# Patient Record
Sex: Female | Born: 1990 | Race: White | Hispanic: No | Marital: Single | Smoking: Never smoker
Health system: Southern US, Community
[De-identification: ages and names within clinical notes are randomized; demographics above are authoritative.]

## PROBLEM LIST (undated history)

## (undated) DIAGNOSIS — R140 Abdominal distension (gaseous): Secondary | ICD-10-CM

## (undated) DIAGNOSIS — R142 Eructation: Secondary | ICD-10-CM

## (undated) DIAGNOSIS — R109 Unspecified abdominal pain: Secondary | ICD-10-CM

## (undated) DIAGNOSIS — R079 Chest pain, unspecified: Secondary | ICD-10-CM

## (undated) HISTORY — DX: Unspecified abdominal pain: R10.9

## (undated) HISTORY — DX: Abdominal distension (gaseous): R14.0

## (undated) HISTORY — DX: Eructation: R14.2

## (undated) HISTORY — PX: WISDOM TOOTH EXTRACTION: SHX21

## (undated) HISTORY — DX: Chest pain, unspecified: R07.9

---

## 2016-05-19 ENCOUNTER — Ambulatory Visit (INDEPENDENT_AMBULATORY_CARE_PROVIDER_SITE_OTHER): Payer: BLUE CROSS/BLUE SHIELD | Admitting: Primary Care

## 2016-05-19 ENCOUNTER — Encounter: Payer: Self-pay | Admitting: Primary Care

## 2016-05-19 ENCOUNTER — Other Ambulatory Visit: Payer: Self-pay | Admitting: Primary Care

## 2016-05-19 VITALS — BP 116/70 | HR 54 | Temp 98.1°F | Ht 66.0 in | Wt 164.0 lb

## 2016-05-19 DIAGNOSIS — R197 Diarrhea, unspecified: Secondary | ICD-10-CM | POA: Diagnosis not present

## 2016-05-19 DIAGNOSIS — R1030 Lower abdominal pain, unspecified: Secondary | ICD-10-CM

## 2016-05-19 NOTE — Progress Notes (Signed)
Subjective:    Patient ID: Bridget Mata, female    DOB: 1991/03/25, 25 y.o.   MRN: [id number]  HPI  Bridget Mata is a 25 year old female who presents today to establish care and discuss the problems mentioned below. Will obtain old records.  1) Urgent Care Follow Up: Presented to Urgent Care on 06/30 with a chief complaint of diarrhea and abdominal pain. Her discomfort began on 04/25/16. She underwent stool testing through urgent care which was negative for C-Diff, Ova, Parasites, Giardia, and other cultures. Urine pregnancy and urinalysis was negative. She had traveled recently prior to her symptoms and was picking and eating raw cherries from a tree.  Since her Urgent Care visit she's noticed improvement in diarrhea but still experiences bilateral lower quadrant pain. Overall her stools are more formed and she is not experiencing much nausea. She's been eating a bland diet and slowly introducing regular food back into her diet without difficulty. Her abdominal pain occurs with and without food. Denies bloody stools, fevers, vomiting, weakness. She's taken Imodium for several days but stopped taking as it caused increased dermal pain.   Review of Systems  Constitutional: Negative for fever, chills and fatigue.  Gastrointestinal: Positive for abdominal pain and diarrhea. Negative for nausea, vomiting, constipation and blood in stool.  Neurological: Negative for weakness.       No past medical history on file.   Social History   Social History  . Marital Status: Single    Spouse Name: N/A  . Number of Children: N/A  . Years of Education: N/A   Occupational History  . Not on file.   Social History Main Topics  . Smoking status: Never Smoker   . Smokeless tobacco: Not on file  . Alcohol Use: 0.0 oz/week    0 Standard drinks or equivalent per week     Comment: social  . Drug Use: Not on file  . Sexual Activity: Not on file   Other Topics Concern  . Not on file   Social  History Narrative   Single.   Works in Chief Financial Officer.   Works for [employer] in [place].   Enjoys volleyball, active in crossfit.     Past Surgical History  Procedure Laterality Date  . Wisdom tooth extraction      Family History  Problem Relation Age of Onset  . Hyperlipidemia Father   . Hypertension Father   . Colitis Mother     Allergies  Allergen Reactions  . Sulfa Antibiotics   . Zinc     No current outpatient prescriptions on file prior to visit.   No current facility-administered medications on file prior to visit.    BP 116/70 mmHg  Pulse 54  Temp(Src) 98.1 F (36.7 C) (Oral)  Ht 5\' 6"  (1.676 m)  Wt 164 lb (74.39 kg)  BMI 26.48 kg/m2  SpO2 98%  LMP 05/04/2016    Objective:   Physical Exam  Constitutional: She appears well-nourished. She does not appear ill.  Neck: Neck supple.  Cardiovascular: Normal rate and regular rhythm.   Pulmonary/Chest: Effort normal and breath sounds normal.  Abdominal: Bowel sounds are normal. There is no tenderness.  Skin: Skin is warm and dry.          Assessment & Plan:  Abdominal pain and diarrhea:  Initially began on June 16, evaluation urgent care on June 30. Workup including stool cultures and urinalysis negative. Exam today without tenderness to abdomen, does not appear acutely ill, vitals stable. Negative  Murphy's and McBurney's. Given negative workup, suspect viral involvement and we'll continue to treat with supportive measures especially given that her symptoms have improved. Do not suspect causes to be related to gallbladder, appendix, H. pylori, acid reflux. Will also obtain CMP and CBC to rule out any other causes. Discussed to avoid Imodium, slowly advance her diet as she has been doing, notify me if she develops fevers/chills/vomiting/increased abdominal pain. Labs pending.  Bridget Mata,Bridget Llanas Kendal, NP

## 2016-05-19 NOTE — Progress Notes (Signed)
Pre visit review using our clinic review tool, if applicable. No additional management support is needed unless otherwise documented below in the visit note. 

## 2016-05-19 NOTE — Patient Instructions (Signed)
Complete lab work prior to leaving today. I will notify you of your results once received.   Please notify me if you develop any fevers, increased pain, vomiting.  It was a pleasure to meet you today! Please don't hesitate to call me with any questions. Welcome to [facility]!

## 2016-05-20 LAB — COMPREHENSIVE METABOLIC PANEL
ALBUMIN: 4.3 g/dL (ref 3.5–5.2)
ALT: 15 U/L (ref 0–35)
AST: 16 U/L (ref 0–37)
Alkaline Phosphatase: 56 U/L (ref 39–117)
BUN: 13 mg/dL (ref 6–23)
CHLORIDE: 103 meq/L (ref 96–112)
CO2: 26 mEq/L (ref 19–32)
Calcium: 9.7 mg/dL (ref 8.4–10.5)
Creatinine, Ser: 0.73 mg/dL (ref 0.40–1.20)
GFR: 104.23 mL/min (ref >60.00)
Glucose, Bld: 86 mg/dL (ref 70–99)
POTASSIUM: 4 meq/L (ref 3.5–5.1)
SODIUM: 137 meq/L (ref 135–145)
Total Bilirubin: 0.3 mg/dL (ref 0.2–1.2)
Total Protein: 7.3 g/dL (ref 6.0–8.3)

## 2016-05-20 LAB — CBC WITH DIFFERENTIAL/PLATELET
BASOS PCT: 0.7 % (ref 0.0–3.0)
Basophils Absolute: 0.1 10*3/uL (ref 0.0–0.1)
EOS PCT: 4.2 % (ref 0.0–5.0)
Eosinophils Absolute: 0.3 10*3/uL (ref 0.0–0.7)
HCT: 38.9 % (ref 36.0–46.0)
HEMOGLOBIN: 13.2 g/dL (ref 12.0–15.0)
LYMPHS ABS: 2.4 10*3/uL (ref 0.7–4.0)
Lymphocytes Relative: 34.8 % (ref 12.0–46.0)
MCHC: 34 g/dL (ref 30.0–36.0)
MCV: 88.3 fl (ref 78.0–100.0)
MONO ABS: 0.7 10*3/uL (ref 0.1–1.0)
Monocytes Relative: 10.1 % (ref 3.0–12.0)
NEUTROS PCT: 50.2 % (ref 43.0–77.0)
Neutro Abs: 3.4 10*3/uL (ref 1.4–7.7)
Platelets: 274 10*3/uL (ref 150.0–400.0)
RBC: 4.41 Mil/uL (ref 3.87–5.11)
RDW: 12.3 % (ref 11.5–15.5)
WBC: 6.9 10*3/uL (ref 4.0–10.5)

## 2016-05-21 ENCOUNTER — Telehealth: Payer: Self-pay | Admitting: Primary Care

## 2016-05-21 NOTE — Telephone Encounter (Signed)
Patient returned Chan's call. °

## 2016-05-21 NOTE — Telephone Encounter (Signed)
Spoken and notified patient of [name]'s comments. Patient verbalized understanding. 

## 2016-08-05 ENCOUNTER — Encounter: Payer: Self-pay | Admitting: Sports Medicine

## 2016-08-05 ENCOUNTER — Other Ambulatory Visit: Payer: Self-pay

## 2016-08-05 ENCOUNTER — Ambulatory Visit
Admission: RE | Admit: 2016-08-05 | Discharge: 2016-08-05 | Disposition: A | Discharge status: Home / Self Care | Payer: BLUE CROSS/BLUE SHIELD | Source: Ambulatory Visit | Attending: Sports Medicine | Admitting: Sports Medicine

## 2016-08-05 ENCOUNTER — Ambulatory Visit (INDEPENDENT_AMBULATORY_CARE_PROVIDER_SITE_OTHER): Payer: BLUE CROSS/BLUE SHIELD | Admitting: Sports Medicine

## 2016-08-05 VITALS — BP 104/71 | Ht 67.0 in | Wt 160.0 lb

## 2016-08-05 DIAGNOSIS — M25512 Pain in left shoulder: Secondary | ICD-10-CM

## 2016-08-05 NOTE — Progress Notes (Signed)
   [facility] [address] [place], [place] [address] Phone: [phone] Fax: [phone]   Patient Name: Bridget Mata Date of Birth: 04/13/1991 Medical Record Number: [id number] Gender: female Date of Encounter: 08/05/2016  History of Present Illness:  Bridget Mata is a 25 year old woman who presents with the following:  Left shoulder pain: Her pain started a year ago. She had started Crossfit 1-2 months prior to the start of her pain. Her pain occurred with overhead exercises (pull ups, etc). She stopped doing these exercises and her pain began to improve. Her pain returned. She has been working with physical therapy for the past 7 weeks, which improved some of the pain but it has not resolved. The pain is located on her anterior shoulder in the biceps tendon region. Activities that exacerbate the pain include pulling pants on, reaching over to her backpack next to her and pulling it towards her. She has not taken any OTC pain meds for her pain. She also has right shoulder pain but this pain is milder. Denies past shoulder surgeries. Denies trauma to her shoulder.  No past medical history on file.   Past Surgical History:  Procedure Laterality Date  . WISDOM TOOTH EXTRACTION     Social History  Substance Use Topics  . Smoking status: Never Smoker  . Smokeless tobacco: Not on file  . Alcohol use 0.0 oz/week     Comment: social   Family History  Problem Relation Age of Onset  . Hyperlipidemia Father   . Hypertension Father   . Colitis Mother    Allergies  Allergen Reactions  . Sulfa Antibiotics   . Zinc     Medication list has been reviewed and updated.  Prior to Admission medications   Medication Sig Start Date End Date Taking? Authorizing Provider  drospirenone-ethinyl estradiol (LORYNA) 3-0.02 MG tablet Take 1 tablet by mouth daily.    Historical Provider, MD    Review of Systems: No fevers, chills, night sweats, weight  loss, chest pain, or shortness of breath.  Physical Examination: Blood pressure 104/71, height 5\' 7"  (1.702 m), weight 72.6 kg (160 lb).  Body mass index is 25.06 kg/m.  Gen: Well NAD  Shoulder: Inspection reveals no abnormalities, atrophy or asymmetry bilaterally. Palpation with no tenderness over AC joint, mild tenderness in L bicipital groove. ROM is full in all planes. Rotator cuff strength normal throughout. Mild pain with empty can on L. Mild pain with Speeds on L. Mild pain with Obrien's on L. Normal scapular function observed. No painful arc and no drop arm sign. No apprehension sign  Assessment and Plan: Left shoulder pain: Mild effusion around biceps tendon and some calcific deposits in glenohumeral joint seen on US. Concern for labral pathology.  -XR of L shoulder -MR arthrogram of L shoulder -Continue PT for now  Griffin BasilJennifer Krall, MD  Internal Medicine PGY-3 08/05/16 11:07 AM   Patient seen and evaluated with the resident. I agree with the above plan of care. X-rays of her left shoulder are unremarkable. Ultrasound shows a relatively normal rotator cuff with some calcification of the posterior labrum. I'm concerned that the patient may have a labral tear which may need arthroscopic debridement or repair. Therefore, we will proceed with an MRI arthrogram to evaluate further. Phone follow-up after that study to discuss the results and delineate further treatment.

## 2016-08-06 ENCOUNTER — Other Ambulatory Visit: Payer: Self-pay | Admitting: Sports Medicine

## 2016-08-13 ENCOUNTER — Other Ambulatory Visit (HOSPITAL_COMMUNITY): Payer: BLUE CROSS/BLUE SHIELD

## 2016-08-13 ENCOUNTER — Ambulatory Visit (HOSPITAL_COMMUNITY): Payer: BLUE CROSS/BLUE SHIELD

## 2016-08-15 ENCOUNTER — Ambulatory Visit (HOSPITAL_COMMUNITY)
Admission: RE | Admit: 2016-08-15 | Discharge: 2016-08-15 | Disposition: A | Discharge status: Home / Self Care | Payer: BLUE CROSS/BLUE SHIELD | Source: Ambulatory Visit | Attending: Sports Medicine | Admitting: Sports Medicine

## 2016-08-15 DIAGNOSIS — M7580 Other shoulder lesions, unspecified shoulder: Secondary | ICD-10-CM | POA: Insufficient documentation

## 2016-08-15 DIAGNOSIS — M25512 Pain in left shoulder: Secondary | ICD-10-CM

## 2016-08-15 MED ORDER — LIDOCAINE HCL 1 % IJ SOLN
INTRAMUSCULAR | Status: AC
  Filled 2016-08-15: qty 10

## 2016-08-15 MED ORDER — GADOBENATE DIMEGLUMINE 529 MG/ML IV SOLN
5.0000 mL | Freq: Once | INTRAVENOUS | Status: AC | PRN
  Administered 2016-08-15: 0.05 mL via INTRA_ARTICULAR

## 2016-08-15 MED ORDER — IOPAMIDOL (ISOVUE-M 200) INJECTION 41%
20.0000 mL | Freq: Once | INTRAMUSCULAR | Status: AC
  Administered 2016-08-15: 10 mL via INTRA_ARTICULAR

## 2016-08-15 MED ORDER — IOPAMIDOL (ISOVUE-M 200) INJECTION 41%
INTRAMUSCULAR | Status: AC
  Administered 2016-08-15: 10 mL via INTRA_ARTICULAR
  Filled 2016-08-15: qty 10

## 2016-08-15 MED ORDER — LIDOCAINE HCL (PF) 1 % IJ SOLN
10.0000 mL | Freq: Once | INTRAMUSCULAR | Status: AC
Start: 2016-08-15 — End: 2016-08-15
  Administered 2016-08-15: 10 mL via INTRADERMAL

## 2016-08-18 ENCOUNTER — Other Ambulatory Visit: Payer: BLUE CROSS/BLUE SHIELD

## 2016-08-21 ENCOUNTER — Encounter: Payer: Self-pay | Admitting: Sports Medicine

## 2016-08-21 ENCOUNTER — Ambulatory Visit (INDEPENDENT_AMBULATORY_CARE_PROVIDER_SITE_OTHER): Payer: BLUE CROSS/BLUE SHIELD | Admitting: Sports Medicine

## 2016-08-21 VITALS — BP 122/74 | HR 54 | Ht 67.0 in | Wt 160.0 lb

## 2016-08-21 DIAGNOSIS — M25512 Pain in left shoulder: Secondary | ICD-10-CM

## 2016-08-21 MED ORDER — DICLOFENAC SODIUM 75 MG PO TBEC: DELAYED_RELEASE_TABLET | ORAL | 0 refills | Status: DC

## 2016-08-22 NOTE — Progress Notes (Signed)
  Patient comes in today to discuss MRI findings of her left shoulder. She has some mild tendinopathy of the infraspinatus tendon. Otherwise, the MRI is unremarkable. No labral tear. No full-thickness rotator cuff tear. I offered her a subacromial cortisone injection but she would rather try oral anti-inflammatories. Therefore, we will try her on some diclofenac and I will have her return to physical therapy. 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 He may need to change up some of his treatments. She will follow-up with me in 4 weeks for reevaluation and if symptoms persist we will reconsider merits of a subacromial cortisone injection.  Total time spent with the patient was 10 minutes with greater than 50% of the time spent in face-to-face consultation discussing MRI findings and treatment options.

## 2016-09-22 ENCOUNTER — Encounter: Payer: Self-pay | Admitting: Sports Medicine

## 2016-09-22 ENCOUNTER — Ambulatory Visit (INDEPENDENT_AMBULATORY_CARE_PROVIDER_SITE_OTHER): Payer: BLUE CROSS/BLUE SHIELD | Admitting: Sports Medicine

## 2016-09-22 VITALS — BP 118/76 | Ht 67.0 in | Wt 160.0 lb

## 2016-09-22 DIAGNOSIS — G8929 Other chronic pain: Secondary | ICD-10-CM | POA: Diagnosis not present

## 2016-09-22 DIAGNOSIS — M25512 Pain in left shoulder: Secondary | ICD-10-CM

## 2016-09-22 MED ORDER — METHYLPREDNISOLONE ACETATE 40 MG/ML IJ SUSP
40.0000 mg | Freq: Once | INTRAMUSCULAR | Status: AC
  Administered 2016-09-22: 40 mg via INTRA_ARTICULAR

## 2016-09-22 NOTE — Progress Notes (Signed)
   Subjective:    Patient ID: Bridget Mata, female    DOB: 01/09/1991, 25 y.o.   MRN: [id number]  HPI    Review of Systems     Objective:   Physical Exam        Assessment & Plan:   

## 2016-09-22 NOTE — Progress Notes (Signed)
Patient ID: Bridget Mata, female   DOB: 05/03/1991, 25 y.o.   MRN: [id number]  Patient comes in today for follow-up on left shoulder pain. Pain is still present despite continuing with physical therapy. We have discussed a subacromial cortisone injection if symptoms persisted. She would like to go ahead with that. Subacromial injection was performed today after risks and benefits were explained. Patient tolerated this without difficulty. If symptoms persist for another 4-6 weeks despite today's injection, she is instructed to call me and I would consider referral to Dr. Teryl LucyJoshua Landau. Although her previous MRI arthrogram showed only some mild infraspinatus tendinopathy, there is a chance that there could be an occult labral tear that was not picked up on that study.  Consent obtained and verified. Time-out conducted. Noted no overlying erythema, induration, or other signs of local infection. Skin prepped in a sterile fashion. Topical analgesic spray: Ethyl chloride. Joint: left shoulder (subacromial) Needle: 25g 1.5 inch Completed without difficulty. Meds: 3cc 1% xylocaine, 1cc (40mg ) depomedrol  Advised to call if fevers/chills, erythema, induration, drainage, or persistent bleeding.

## 2016-09-22 NOTE — Progress Notes (Signed)
   Subjective:    Patient ID: Bridget Mata, female    DOB: 01/22/1991, 25 y.o.   MRN: [id number]  HPI    Review of Systems     Objective:   Physical Exam        Assessment & Plan:

## 2016-09-26 DIAGNOSIS — Z6825 Body mass index (BMI) 25.0-25.9, adult: Secondary | ICD-10-CM | POA: Diagnosis not present

## 2016-09-26 DIAGNOSIS — Z23 Encounter for immunization: Secondary | ICD-10-CM | POA: Diagnosis not present

## 2016-12-10 DIAGNOSIS — G8929 Other chronic pain: Secondary | ICD-10-CM | POA: Diagnosis not present

## 2016-12-10 DIAGNOSIS — M25512 Pain in left shoulder: Secondary | ICD-10-CM | POA: Diagnosis not present

## 2016-12-10 DIAGNOSIS — M25511 Pain in right shoulder: Secondary | ICD-10-CM | POA: Diagnosis not present

## 2017-01-21 DIAGNOSIS — H6692 Otitis media, unspecified, left ear: Secondary | ICD-10-CM | POA: Diagnosis not present

## 2017-01-21 DIAGNOSIS — J069 Acute upper respiratory infection, unspecified: Secondary | ICD-10-CM | POA: Diagnosis not present

## 2017-01-23 DIAGNOSIS — G8918 Other acute postprocedural pain: Secondary | ICD-10-CM | POA: Diagnosis not present

## 2017-01-23 DIAGNOSIS — M25312 Other instability, left shoulder: Secondary | ICD-10-CM | POA: Diagnosis not present

## 2017-01-23 DIAGNOSIS — M24812 Other specific joint derangements of left shoulder, not elsewhere classified: Secondary | ICD-10-CM | POA: Diagnosis not present

## 2017-01-23 DIAGNOSIS — M25512 Pain in left shoulder: Secondary | ICD-10-CM | POA: Diagnosis not present

## 2017-01-23 DIAGNOSIS — S43014A Anterior dislocation of right humerus, initial encounter: Secondary | ICD-10-CM | POA: Diagnosis not present

## 2017-01-23 DIAGNOSIS — M24412 Recurrent dislocation, left shoulder: Secondary | ICD-10-CM | POA: Diagnosis not present

## 2017-01-23 DIAGNOSIS — Z4789 Encounter for other orthopedic aftercare: Secondary | ICD-10-CM | POA: Diagnosis not present

## 2017-01-23 DIAGNOSIS — R600 Localized edema: Secondary | ICD-10-CM | POA: Diagnosis not present

## 2017-02-20 DIAGNOSIS — M25312 Other instability, left shoulder: Secondary | ICD-10-CM | POA: Diagnosis not present

## 2017-02-20 DIAGNOSIS — R600 Localized edema: Secondary | ICD-10-CM | POA: Diagnosis not present

## 2017-02-20 DIAGNOSIS — M25512 Pain in left shoulder: Secondary | ICD-10-CM | POA: Diagnosis not present

## 2017-02-20 DIAGNOSIS — Z4789 Encounter for other orthopedic aftercare: Secondary | ICD-10-CM | POA: Diagnosis not present

## 2017-03-04 DIAGNOSIS — M25512 Pain in left shoulder: Secondary | ICD-10-CM | POA: Diagnosis not present

## 2017-03-09 DIAGNOSIS — M25512 Pain in left shoulder: Secondary | ICD-10-CM | POA: Diagnosis not present

## 2017-03-11 DIAGNOSIS — Z6826 Body mass index (BMI) 26.0-26.9, adult: Secondary | ICD-10-CM | POA: Diagnosis not present

## 2017-03-11 DIAGNOSIS — Z01419 Encounter for gynecological examination (general) (routine) without abnormal findings: Secondary | ICD-10-CM | POA: Diagnosis not present

## 2017-03-12 DIAGNOSIS — M25512 Pain in left shoulder: Secondary | ICD-10-CM | POA: Diagnosis not present

## 2017-03-16 DIAGNOSIS — M25512 Pain in left shoulder: Secondary | ICD-10-CM | POA: Diagnosis not present

## 2017-03-16 IMAGING — CR DG SHOULDER 2+V*L*
3 series · 3 of 3 positions shown · non-contrast
Comparison: None.

CLINICAL DATA: Pain for 4 months

EXAM:
LEFT SHOULDER - 2+ VIEW

[w shoulder ap internal left]
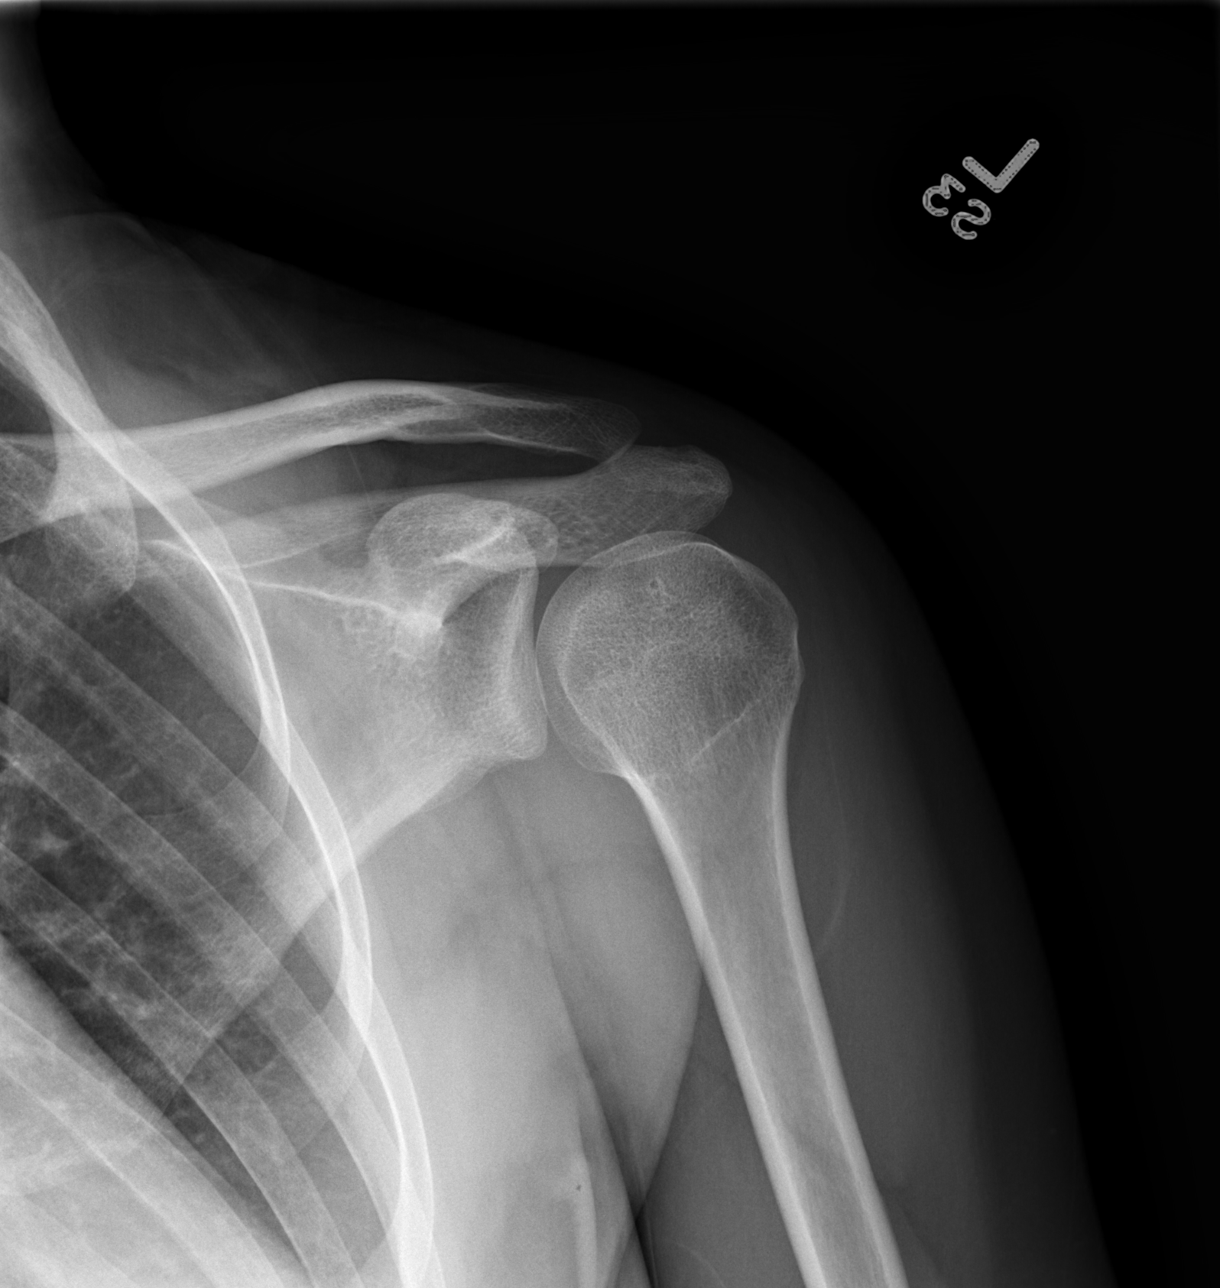

[w shoulder ap external left *]
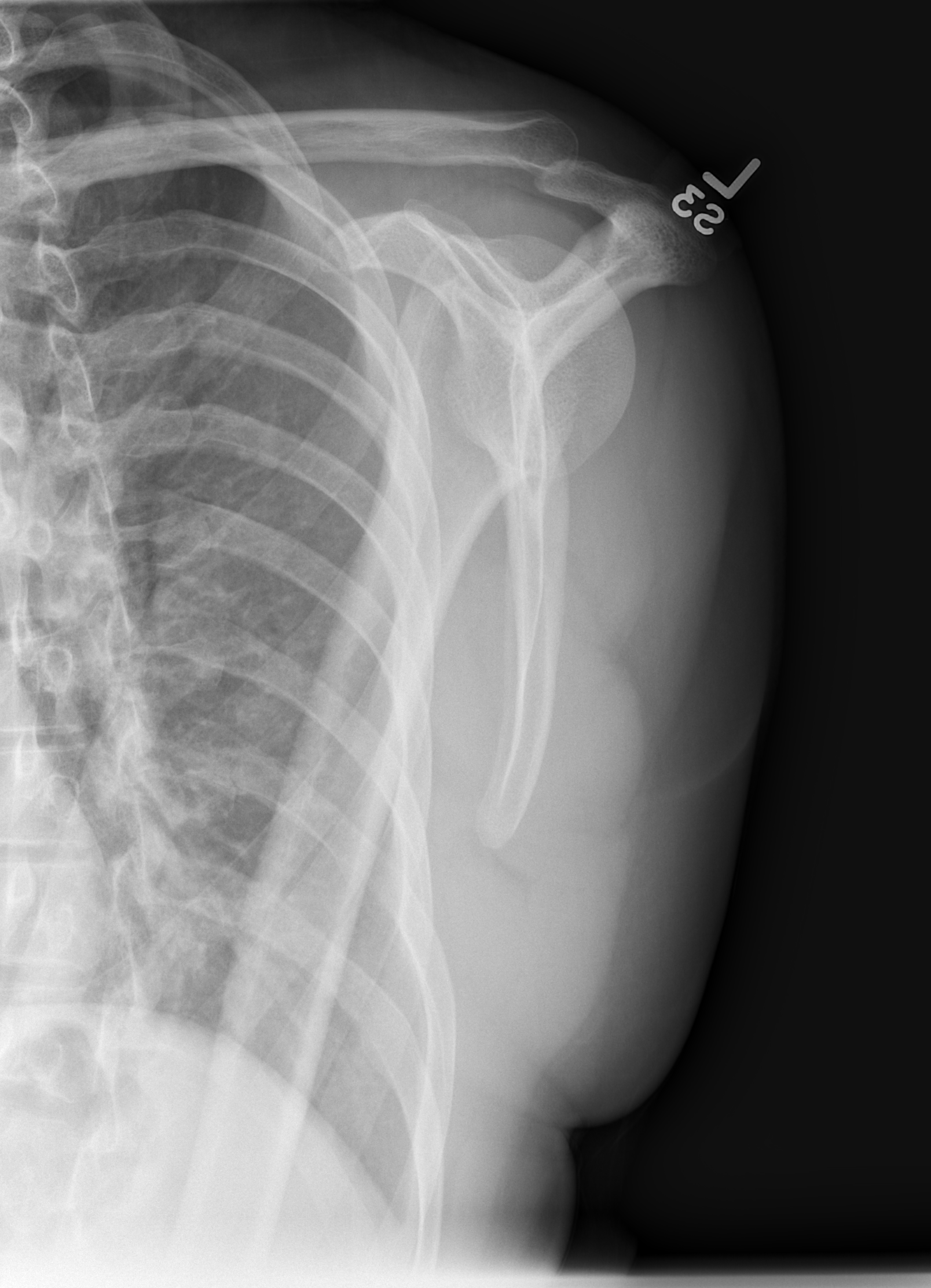

[w shoulder y view left *]
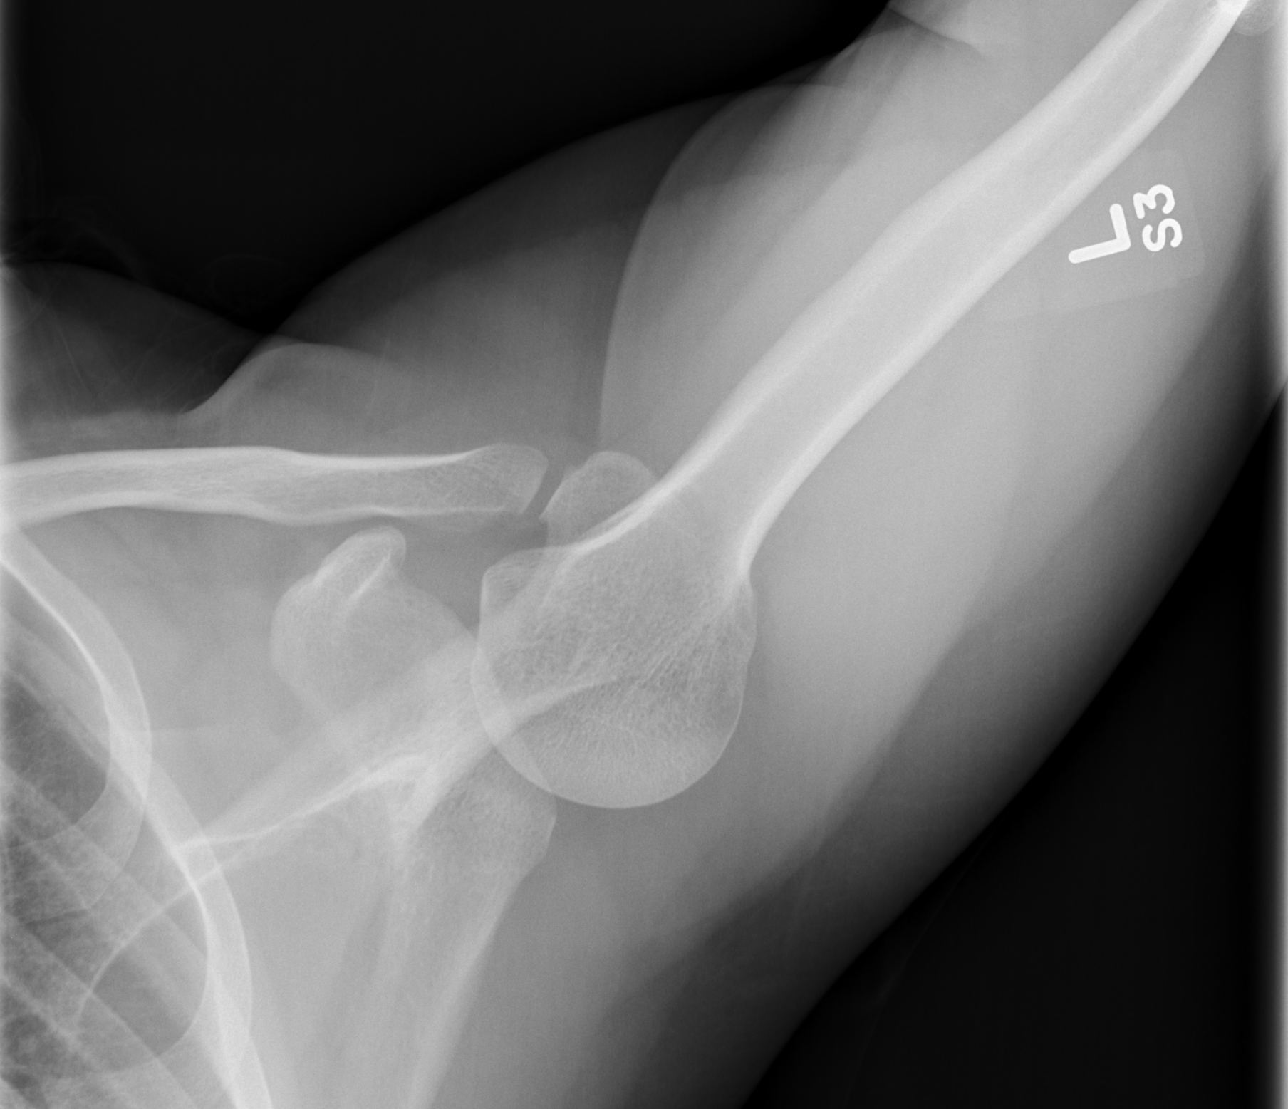

[3 of 3 positions shown; findings below may reference images not displayed]

FINDINGS: Frontal, Y scapular, and axillary images were obtained. There is no
fracture or dislocation. Joint spaces appear normal. No erosive
change. Visualized left lung is clear.
IMPRESSION: No fracture or dislocation.  No evident arthropathy.

## 2017-03-24 DIAGNOSIS — M25512 Pain in left shoulder: Secondary | ICD-10-CM | POA: Diagnosis not present

## 2017-03-26 DIAGNOSIS — M25512 Pain in left shoulder: Secondary | ICD-10-CM | POA: Diagnosis not present

## 2017-03-26 IMAGING — MR MR SHOULDER*L* W/ CM
4 of 6 series · 19 of 40 positions shown · IV contrast (YES YES)
Comparison: None.

CLINICAL DATA: Left shoulder pain.  Pain with overhead exercises.

EXAM:
MR ARTHROGRAM OF THE LEFT SHOULDER
TECHNIQUE: Multiplanar, multisequence MR imaging of the left shoulder was
performed following the administration of intra-articular contrast.
CONTRAST:  See Injection Documentation.

[Series 3: T1 fat-sat · axial · 3.0mm · 0.23mm/px · z∈[-79,-20]mm · 3 of 27 slices shown (1 of 2)]
[im 4/27]
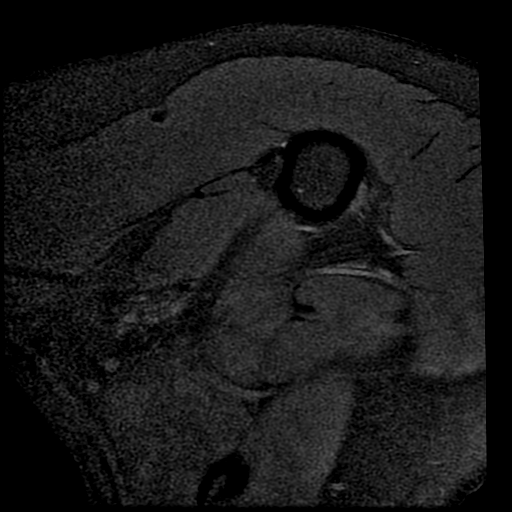
[im 15/27]
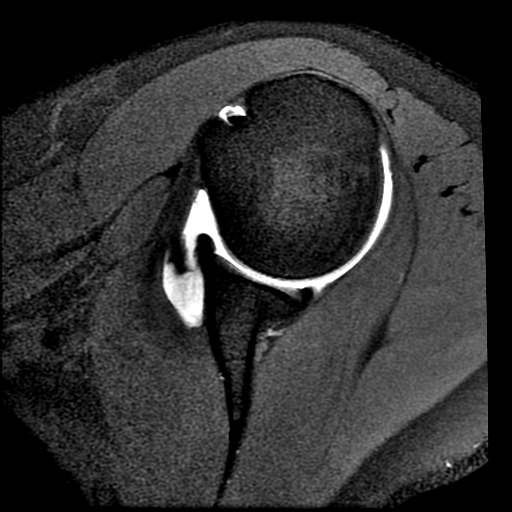
[im 23/27]
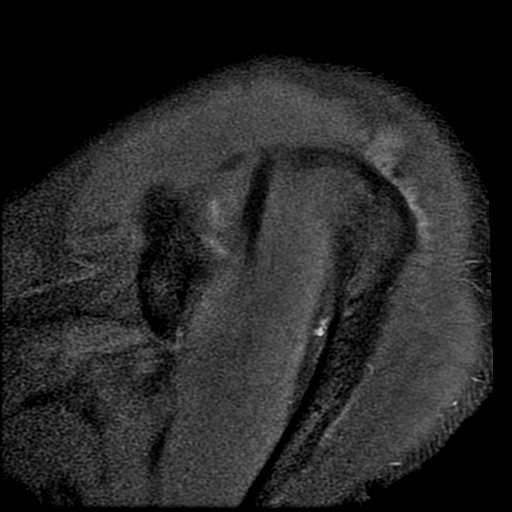

[Series 4: T1 fat-sat · oblique · 3.0mm · 0.27mm/px · 3 of 25 slices shown (2 of 2)]
[im 5/25]
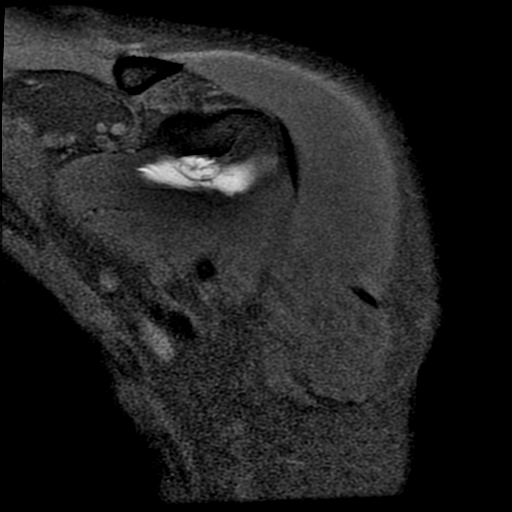
[im 15/25]
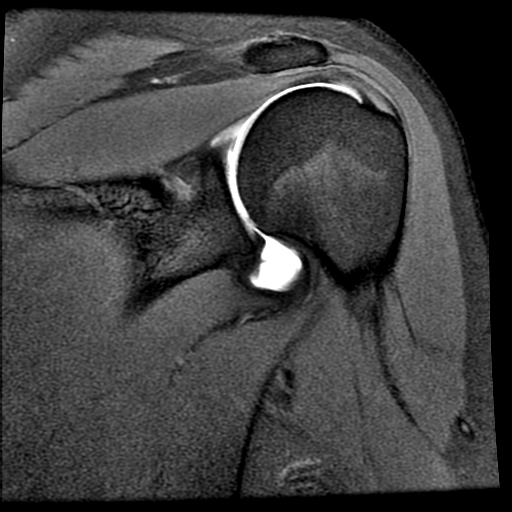
[im 25/25]
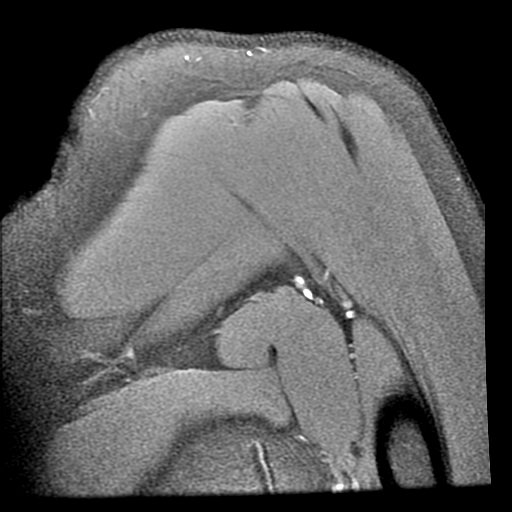

[Series 6: T2 fat-sat · oblique · 3.0mm · 0.27mm/px · 6 of 25 slices shown (1 of 2)]
[im 1/25]
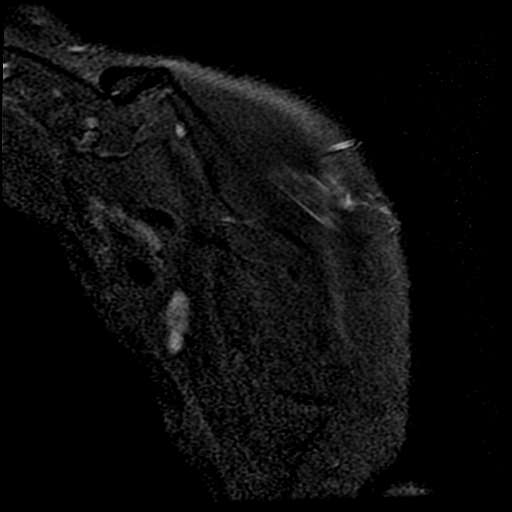
[im 5/25]
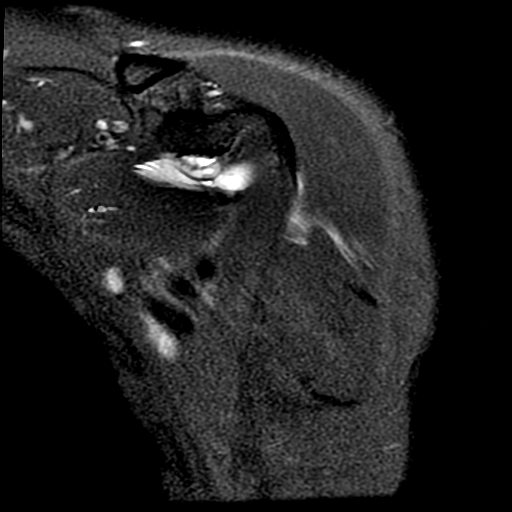
[im 10/25]
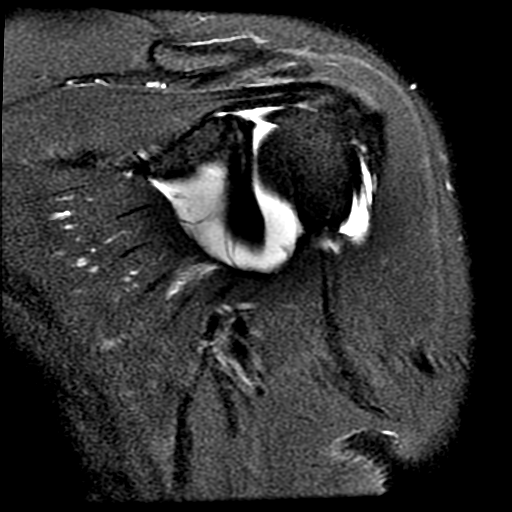
[im 15/25]
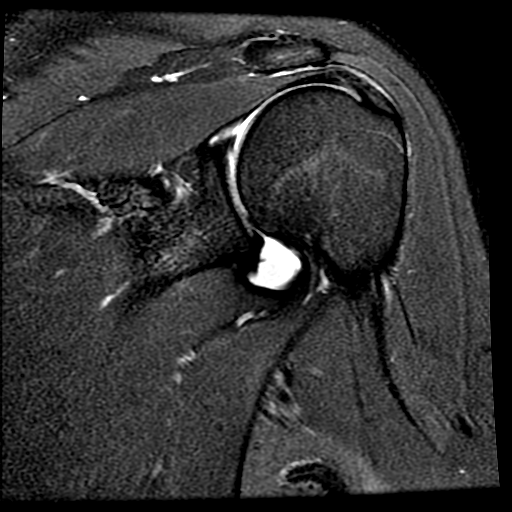
[im 20/25]
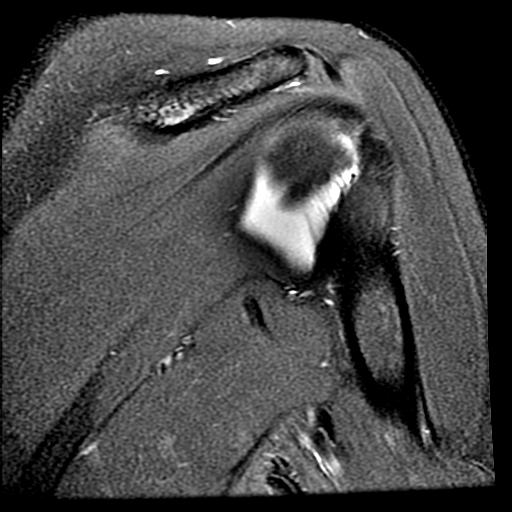
[im 25/25]
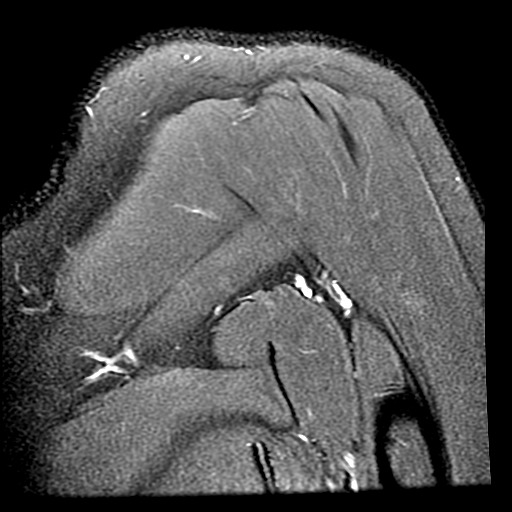

[Series 7: T2 fat-sat · sagittal · 3.0mm · 0.27mm/px · 7 of 31 slices shown (2 of 2)]
[im 1/31]
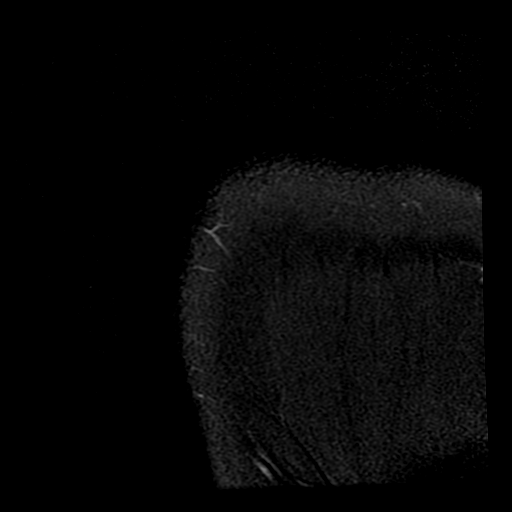
[im 5/31]
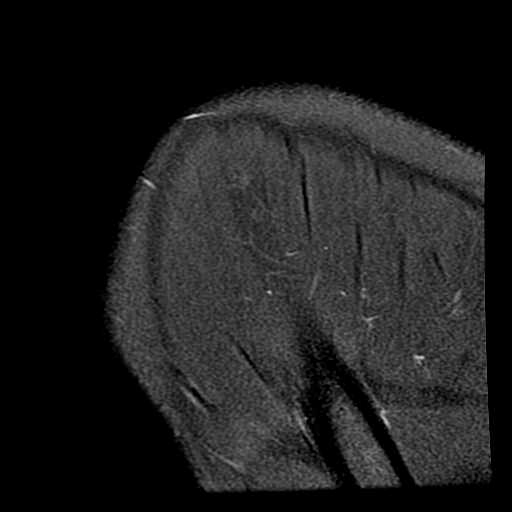
[im 9/31]
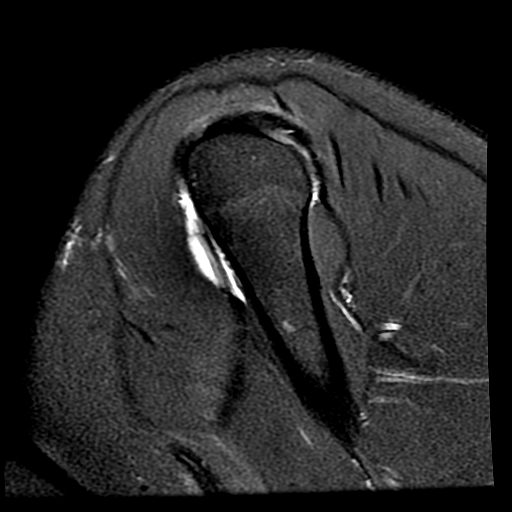
[im 13/31]
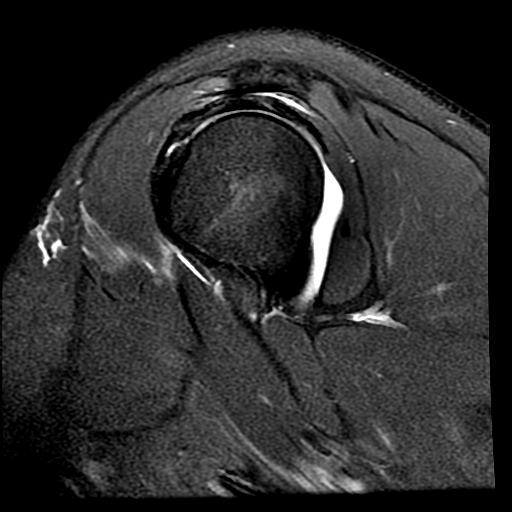
[im 18/31]
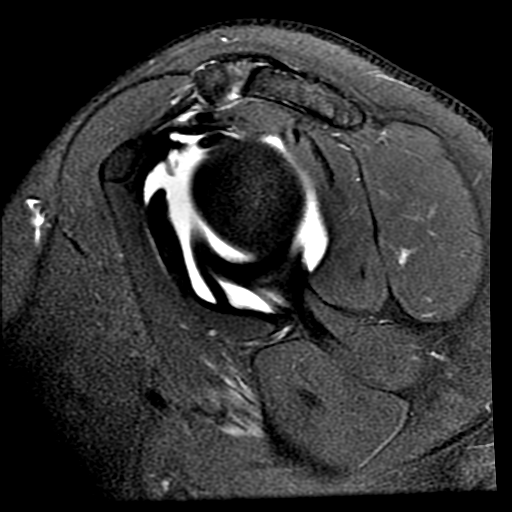
[im 22/31]
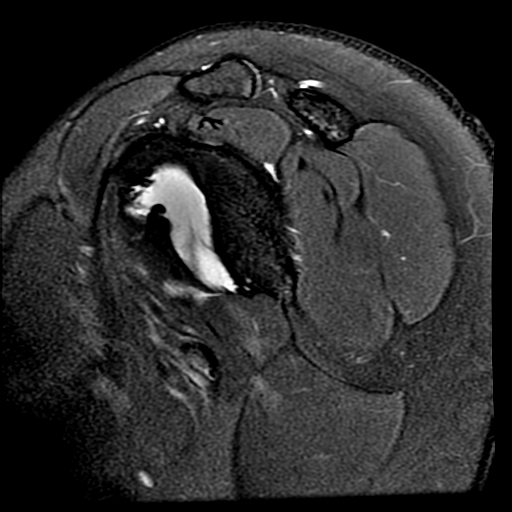
[im 26/31]
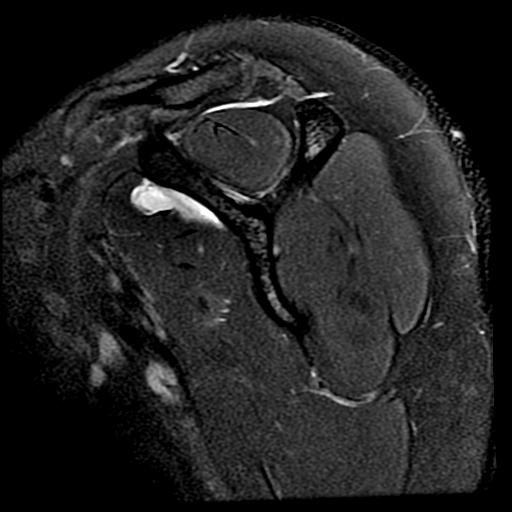

[19 of 40 positions shown; findings below may reference images not displayed]

FINDINGS: Rotator cuff: Supraspinatus tendon is intact. Mild tendinosis of the
infraspinatus tendon. Teres minor tendon is intact. Subscapularis
tendon is intact.

Muscles: No atrophy or fatty replacement of nor abnormal signal
within, the muscles of the rotator cuff.

Biceps long head: Intact.

Acromioclavicular Joint: Normal acromioclavicular joint. Trace
subacromial/ subdeltoid bursal fluid. Type II acromion.

Glenohumeral Joint: Intraarticular contrast distending the joint
capsule. No chondral defect. Normal glenohumeral ligaments.

Labrum: Intact.

Bones: No marrow signal abnormality.  No fracture or dislocation.
IMPRESSION: 1. Mild tendinosis of the infraspinatus tendon.

## 2017-03-30 DIAGNOSIS — M25512 Pain in left shoulder: Secondary | ICD-10-CM | POA: Diagnosis not present

## 2017-04-07 DIAGNOSIS — M25512 Pain in left shoulder: Secondary | ICD-10-CM | POA: Diagnosis not present

## 2017-04-09 DIAGNOSIS — M25512 Pain in left shoulder: Secondary | ICD-10-CM | POA: Diagnosis not present

## 2017-04-13 DIAGNOSIS — M25512 Pain in left shoulder: Secondary | ICD-10-CM | POA: Diagnosis not present

## 2017-04-13 DIAGNOSIS — M25511 Pain in right shoulder: Secondary | ICD-10-CM | POA: Diagnosis not present

## 2017-04-13 DIAGNOSIS — G8929 Other chronic pain: Secondary | ICD-10-CM | POA: Diagnosis not present

## 2017-04-14 DIAGNOSIS — M25512 Pain in left shoulder: Secondary | ICD-10-CM | POA: Diagnosis not present

## 2017-04-16 DIAGNOSIS — M25512 Pain in left shoulder: Secondary | ICD-10-CM | POA: Diagnosis not present

## 2017-04-23 DIAGNOSIS — M25512 Pain in left shoulder: Secondary | ICD-10-CM | POA: Diagnosis not present

## 2017-04-29 DIAGNOSIS — M25311 Other instability, right shoulder: Secondary | ICD-10-CM | POA: Diagnosis not present

## 2017-04-29 DIAGNOSIS — M25312 Other instability, left shoulder: Secondary | ICD-10-CM | POA: Diagnosis not present

## 2017-04-29 DIAGNOSIS — Z4789 Encounter for other orthopedic aftercare: Secondary | ICD-10-CM | POA: Diagnosis not present

## 2017-06-16 DIAGNOSIS — G8918 Other acute postprocedural pain: Secondary | ICD-10-CM | POA: Diagnosis not present

## 2017-06-16 DIAGNOSIS — M24411 Recurrent dislocation, right shoulder: Secondary | ICD-10-CM | POA: Diagnosis not present

## 2017-06-16 DIAGNOSIS — I890 Lymphedema, not elsewhere classified: Secondary | ICD-10-CM | POA: Diagnosis not present

## 2017-06-16 DIAGNOSIS — M19011 Primary osteoarthritis, right shoulder: Secondary | ICD-10-CM | POA: Diagnosis not present

## 2017-06-16 DIAGNOSIS — M25511 Pain in right shoulder: Secondary | ICD-10-CM | POA: Diagnosis not present

## 2017-06-16 DIAGNOSIS — M25311 Other instability, right shoulder: Secondary | ICD-10-CM | POA: Diagnosis not present

## 2017-07-29 DIAGNOSIS — M25511 Pain in right shoulder: Secondary | ICD-10-CM | POA: Diagnosis not present

## 2017-08-04 DIAGNOSIS — M25512 Pain in left shoulder: Secondary | ICD-10-CM | POA: Diagnosis not present

## 2017-08-07 DIAGNOSIS — M25512 Pain in left shoulder: Secondary | ICD-10-CM | POA: Diagnosis not present

## 2017-08-12 DIAGNOSIS — M25512 Pain in left shoulder: Secondary | ICD-10-CM | POA: Diagnosis not present

## 2017-08-14 DIAGNOSIS — M25511 Pain in right shoulder: Secondary | ICD-10-CM | POA: Diagnosis not present

## 2017-08-19 DIAGNOSIS — M25511 Pain in right shoulder: Secondary | ICD-10-CM | POA: Diagnosis not present

## 2017-08-21 DIAGNOSIS — M25511 Pain in right shoulder: Secondary | ICD-10-CM | POA: Diagnosis not present

## 2017-08-25 DIAGNOSIS — M25511 Pain in right shoulder: Secondary | ICD-10-CM | POA: Diagnosis not present

## 2017-08-27 DIAGNOSIS — M25511 Pain in right shoulder: Secondary | ICD-10-CM | POA: Diagnosis not present

## 2017-09-03 DIAGNOSIS — M25511 Pain in right shoulder: Secondary | ICD-10-CM | POA: Diagnosis not present

## 2017-09-08 DIAGNOSIS — M25511 Pain in right shoulder: Secondary | ICD-10-CM | POA: Diagnosis not present

## 2017-09-14 DIAGNOSIS — M25511 Pain in right shoulder: Secondary | ICD-10-CM | POA: Diagnosis not present

## 2017-09-16 DIAGNOSIS — Z4789 Encounter for other orthopedic aftercare: Secondary | ICD-10-CM | POA: Diagnosis not present

## 2017-09-18 DIAGNOSIS — M25511 Pain in right shoulder: Secondary | ICD-10-CM | POA: Diagnosis not present

## 2017-09-22 DIAGNOSIS — M25511 Pain in right shoulder: Secondary | ICD-10-CM | POA: Diagnosis not present

## 2017-09-29 DIAGNOSIS — M25511 Pain in right shoulder: Secondary | ICD-10-CM | POA: Diagnosis not present

## 2017-10-13 DIAGNOSIS — M25511 Pain in right shoulder: Secondary | ICD-10-CM | POA: Diagnosis not present

## 2017-10-15 DIAGNOSIS — M25511 Pain in right shoulder: Secondary | ICD-10-CM | POA: Diagnosis not present

## 2017-10-23 DIAGNOSIS — Z4789 Encounter for other orthopedic aftercare: Secondary | ICD-10-CM | POA: Diagnosis not present

## 2017-11-17 DIAGNOSIS — J019 Acute sinusitis, unspecified: Secondary | ICD-10-CM | POA: Diagnosis not present

## 2018-05-06 DIAGNOSIS — N898 Other specified noninflammatory disorders of vagina: Secondary | ICD-10-CM | POA: Diagnosis not present

## 2018-05-06 DIAGNOSIS — Z6826 Body mass index (BMI) 26.0-26.9, adult: Secondary | ICD-10-CM | POA: Diagnosis not present

## 2018-05-06 DIAGNOSIS — Z124 Encounter for screening for malignant neoplasm of cervix: Secondary | ICD-10-CM | POA: Diagnosis not present

## 2018-05-06 DIAGNOSIS — Z01419 Encounter for gynecological examination (general) (routine) without abnormal findings: Secondary | ICD-10-CM | POA: Diagnosis not present

## 2018-05-06 LAB — HM PAP SMEAR

## 2018-07-27 DIAGNOSIS — L578 Other skin changes due to chronic exposure to nonionizing radiation: Secondary | ICD-10-CM | POA: Diagnosis not present

## 2018-07-27 DIAGNOSIS — L814 Other melanin hyperpigmentation: Secondary | ICD-10-CM | POA: Diagnosis not present

## 2018-07-27 DIAGNOSIS — D225 Melanocytic nevi of trunk: Secondary | ICD-10-CM | POA: Diagnosis not present

## 2018-08-30 ENCOUNTER — Ambulatory Visit: Payer: Self-pay | Admitting: *Deleted

## 2018-08-30 ENCOUNTER — Telehealth: Payer: Self-pay

## 2018-08-30 ENCOUNTER — Ambulatory Visit: Payer: BLUE CROSS/BLUE SHIELD | Admitting: Physician Assistant

## 2018-08-30 ENCOUNTER — Encounter: Payer: Self-pay | Admitting: Physician Assistant

## 2018-08-30 VITALS — BP 120/72 | HR 62 | Temp 98.6°F | Ht 67.0 in | Wt 160.0 lb

## 2018-08-30 DIAGNOSIS — M545 Low back pain, unspecified: Secondary | ICD-10-CM

## 2018-08-30 MED ORDER — MELOXICAM 15 MG PO TABS: 15.0000 mg | ORAL_TABLET | Freq: Every day | ORAL | 0 refills | Status: DC

## 2018-08-30 MED ORDER — CYCLOBENZAPRINE HCL 5 MG PO TABS: 5.0000 mg | ORAL_TABLET | Freq: Every day | ORAL | 0 refills | Status: DC | PRN

## 2018-08-30 NOTE — Telephone Encounter (Signed)
Please advise on whom would be willing to accept patient as TOC.

## 2018-08-30 NOTE — Telephone Encounter (Signed)
Sam saw her today. Could go to her.

## 2018-08-30 NOTE — Telephone Encounter (Signed)
Absolutely. Fine with me 

## 2018-08-30 NOTE — Progress Notes (Signed)
Bridget Mata is a 27 y.o. female here for a new problem.  I acted as a Neurosurgeon for [employer], PA-C Corky Mull, LPN  History of Present Illness:   Chief Complaint  Patient presents with  . Back Pain    Back Pain  This is a new problem. Episode onset: Pt injured herself at the gym yesterday fell off pull up bar. The problem occurs constantly. The problem is unchanged. The pain is present in the lumbar spine. The quality of the pain is described as aching. The pain does not radiate. The pain is at a severity of 5/10. The pain is moderate. The pain is the same all the time. The symptoms are aggravated by position. Stiffness is present all day. Pertinent negatives include no fever, headaches, leg pain, numbness or tingling. She has tried NSAIDs and ice for the symptoms. The treatment provided no relief.   Denies loss of bowel/bladder function.  Has taken ibuprofen (2 tablets) a total of 2 times.  Ice is helping more than anything, per her report.  History reviewed. No pertinent past medical history.   Social History   Socioeconomic History  . Marital status: Single    Spouse name: Not on file  . Number of children: Not on file  . Years of education: Not on file  . Highest education level: Not on file  Occupational History  . Not on file  Social Needs  . Financial resource strain: Not on file  . Food insecurity:    Worry: Not on file    Inability: Not on file  . Transportation needs:    Medical: Not on file    Non-medical: Not on file  Tobacco Use  . Smoking status: Never Smoker  . Smokeless tobacco: Never Used  Substance and Sexual Activity  . Alcohol use: Yes    Alcohol/week: 0.0 standard drinks    Comment: social  . Drug use: Never  . Sexual activity: Not on file  Lifestyle  . Physical activity:    Days per week: Not on file    Minutes per session: Not on file  . Stress: Not on file  Relationships  . Social connections:    Talks on phone: Not on file     Gets together: Not on file    Attends religious service: Not on file    Active member of club or organization: Not on file    Attends meetings of clubs or organizations: Not on file    Relationship status: Not on file  . Intimate partner violence:    Fear of current or ex partner: Not on file    Emotionally abused: Not on file    Physically abused: Not on file    Forced sexual activity: Not on file  Other Topics Concern  . Not on file  Social History Narrative   Single.   Works in Chief Financial Officer.   Works for [employer] in [place].   Enjoys volleyball, active in crossfit.     Past Surgical History:  Procedure Laterality Date  . WISDOM TOOTH EXTRACTION      Family History  Problem Relation Age of Onset  . Hyperlipidemia Father   . Hypertension Father   . Colitis Mother     Allergies  Allergen Reactions  . Sulfa Antibiotics   . Zinc     Current Medications:   Current Outpatient Medications:  .  drospirenone-ethinyl estradiol (NIKKI) 3-0.02 MG tablet, Nikki (28) 3 mg-0.02 mg tablet  Take 1 tablet every  day by oral route., Disp: , Rfl:  .  cyclobenzaprine (FLEXERIL) 5 MG tablet, Take 1 tablet (5 mg total) by mouth daily as needed for muscle spasms., Disp: 20 tablet, Rfl: 0 .  meloxicam (MOBIC) 15 MG tablet, Take 1 tablet (15 mg total) by mouth daily., Disp: 30 tablet, Rfl: 0   Review of Systems:   Review of Systems  Constitutional: Negative for fever.  Musculoskeletal: Positive for back pain.  Neurological: Negative for tingling, numbness and headaches.    Vitals:   Vitals:   08/30/18 1354  BP: 120/72  Pulse: 62  Temp: 98.6 F (37 C)  TempSrc: Oral  SpO2: 100%  Weight: 160 lb (72.6 kg)  Height: 5\' 7"  (1.702 m)     Body mass index is 25.06 kg/m.  Physical Exam:   Physical Exam  Constitutional: She appears well-developed. She is cooperative.  Non-toxic appearance. She does not have a sickly appearance. She does not appear ill. No distress.  Cardiovascular:  Normal rate, regular rhythm, S1 normal, S2 normal, normal heart sounds and normal pulses.  No LE edema  Pulmonary/Chest: Effort normal and breath sounds normal.  Musculoskeletal:  No decreased ROM 2/2 pain with flexion/extension, lateral side bends. Slight decreased ROM with rotation. Reproducible tenderness with deep palpation to R lower lumbar paraspinal muscles near R SI joint. No bony tenderness. No evidence of erythema, rash or ecchymosis. Negative STLR bilaterally.  Neurological: She is alert. She has normal strength. She displays no atrophy. No cranial nerve deficit or sensory deficit. Coordination and gait normal. GCS eye subscore is 4. GCS verbal subscore is 5. GCS motor subscore is 6.  Skin: Skin is warm, dry and intact.  Psychiatric: She has a normal mood and affect. Her speech is normal and behavior is normal.  Nursing note and vitals reviewed.     Assessment and Plan:    Bridget Mata was seen today for back pain.  Diagnoses and all orders for this visit:  Acute right-sided low back pain, unspecified whether sciatica present No red flags on exam. Discussed using daily meloxicam 15 mg and prn flexeril 5 mg. If no improvement after 1 week of symptoms, make an appointment with Dr. Berline Chough, sooner if worsening or other concerns.  Other orders -     meloxicam (MOBIC) 15 MG tablet; Take 1 tablet (15 mg total) by mouth daily. -     cyclobenzaprine (FLEXERIL) 5 MG tablet; Take 1 tablet (5 mg total) by mouth daily as needed for muscle spasms.    . Reviewed expectations re: course of current medical issues. . Discussed self-management of symptoms. . Outlined signs and symptoms indicating need for more acute intervention. . Patient verbalized understanding and all questions were answered. . See orders for this visit as documented in the electronic medical record. . Patient received an After-Visit Summary.  CMA or LPN served as scribe during this visit. History, Physical, and Plan performed  by medical provider. The above documentation has been reviewed and is accurate and complete.   Jarold Motto, PA-C

## 2018-08-30 NOTE — Telephone Encounter (Signed)
See note

## 2018-08-30 NOTE — Patient Instructions (Signed)
If after one week you are not feeling like you are improving, please call and make an appointment here in our office with Dr. Berline Chough.  Start daily Meloxicam to help with inflammation.  May trial muscle relaxer to see if this can help with your muscle spasm.

## 2018-08-30 NOTE — Telephone Encounter (Signed)
Contact patient to schedule TOC appointment with Jarold Motto, PA-C

## 2018-08-30 NOTE — Telephone Encounter (Signed)
Copied from CRM [id number]. Topic: Quick Communication - See Telephone Encounter >> Aug 30, 2018 11:11 AM Waymon Amato wrote: Pt is wanting to transfer from [facility] to [place] because it is closer [employer] number when been approved [phone]

## 2018-08-30 NOTE — Telephone Encounter (Signed)
I'd be happy to have her as a patient.  Bridget Mata

## 2018-08-30 NOTE — Telephone Encounter (Signed)
Pt states she was at the gym on yesterday around 10 am and fell from the pull up bar while working out. Pt is complaining of pain to the lower back right above her buttocks. Pt states she is still able to move back and extremities without difficulty or numbness. Pt rates pain at 8, when it is at its worse and states she has taken ibuprofen and it may or may not have helped with the pain.Pt states she does not have any bruising and her back may be a little swollen right above buttocks but it is hard to see due to location. No appt availability with K. Clark,NP. Pt requesting to be seen for appt at [place] location due to it being closer to her home. Appt scheduled for today at 2pm with S. Worley,PA. Reason for Disposition . [1] Landed hard on feet or buttocks AND [2] pain over spine  Answer Assessment - Initial Assessment Questions 1. MECHANISM: "How did the injury happen?" (Consider the possibility of domestic violence or elder abuse)     Pt was the gym yesterday and fell from the pull up bar  2. ONSET: "When did the injury happen?" (Minutes or hours ago)    Around 10 am on yesterday 3. LOCATION: "What part of the back is injured?"     Lower back above right above buttocks 4. SEVERITY: "Can you move the back normally?"     Pt is able to move back normally but states it is painful 5. PAIN: "Is there any pain?" If so, ask: "How bad is the pain?"   (Scale 1-10; or mild, moderate, severe)     Rates pain at 8 6. CORD SYMPTOMS: Any weakness or numbness of the arms or legs?"     Pt denies any numbness, tingling or weakness 7. SIZE: For cuts, bruises, or swelling, ask: "How large is it?" (e.g., inches or centimeters)     Pt states she may have a little swelling but it is hard to tell 8. TETANUS: For any breaks in the skin, ask: "When was the last tetanus booster?"     n/a 9. OTHER SYMPTOMS: "Do you have any other symptoms?" (e.g., abdominal pain, blood in urine)     No other symptoms at this  time 10. PREGNANCY: "Is there any chance you are pregnant?" "When was your last menstrual period?"       No, last mestrual cycle 3 weeks ago  Protocols used: BACK INJURY-A-AH

## 2018-09-02 ENCOUNTER — Encounter: Payer: Self-pay | Admitting: Physician Assistant

## 2018-09-02 ENCOUNTER — Ambulatory Visit: Payer: BLUE CROSS/BLUE SHIELD | Admitting: Physician Assistant

## 2018-09-02 VITALS — BP 110/60 | HR 55 | Temp 98.3°F | Wt 163.4 lb

## 2018-09-02 DIAGNOSIS — Z0000 Encounter for general adult medical examination without abnormal findings: Secondary | ICD-10-CM | POA: Diagnosis not present

## 2018-09-02 NOTE — Progress Notes (Signed)
Bridget Mata is a 27 y.o. female here for a new problem.  History of Present Illness:   Chief Complaint  Patient presents with  . Establish Care    Pt present to transfer care.     Acute Concerns: None  Chronic Issues: None  Health Maintenance: Immunizations -- UTD, declines flu and tetanus Colonoscopy -- n/a Mammogram -- n/a PAP -- UTD -- 05/06/18 --> NILM Diet -- healthy diet Caffeine intake -- coffee x 1 in the morning Sleep habits -- good sleep habits, no issues Exercise -- Crossfit x 3 years Weight -- Weight: 163 lb 6.4 oz (74.1 kg)  --> normal for her Mood -- no issues Alcohol -- social, none excessive Tobacco -- none Periods -- no excessive bleeding or pain, predictable with her OCPs  Depression screen [facility] 2/9 09/22/2016  Decreased Interest 0  Down, Depressed, Hopeless 0  PHQ - 2 Score 0    No flowsheet data found.  Other providers/specialists: [facility] doctor Dentist  No past medical history on file.   Social History   Socioeconomic History  . Marital status: Single    Spouse name: Not on file  . Number of children: Not on file  . Years of education: Not on file  . Highest education level: Not on file  Occupational History  . Not on file  Social Needs  . Financial resource strain: Not on file  . Food insecurity:    Worry: Not on file    Inability: Not on file  . Transportation needs:    Medical: Not on file    Non-medical: Not on file  Tobacco Use  . Smoking status: Never Smoker  . Smokeless tobacco: Never Used  Substance and Sexual Activity  . Alcohol use: Yes    Alcohol/week: 0.0 standard drinks    Comment: social  . Drug use: Never  . Sexual activity: Not on file  Lifestyle  . Physical activity:    Days per week: Not on file    Minutes per session: Not on file  . Stress: Not on file  Relationships  . Social connections:    Talks on phone: Not on file    Gets together: Not on file    Attends religious service: Not on file     Active member of club or organization: Not on file    Attends meetings of clubs or organizations: Not on file    Relationship status: Not on file  . Intimate partner violence:    Fear of current or ex partner: Not on file    Emotionally abused: Not on file    Physically abused: Not on file    Forced sexual activity: Not on file  Other Topics Concern  . Not on file  Social History Narrative   Single.   Works in Chief Financial Officer.   Works for [employer] in [place].   Enjoys volleyball, active in crossfit.     Past Surgical History:  Procedure Laterality Date  . WISDOM TOOTH EXTRACTION      Family History  Problem Relation Age of Onset  . Hyperlipidemia Father   . Hypertension Father   . Breast cancer Neg Hx   . Colon cancer Neg Hx     Allergies  Allergen Reactions  . Sulfa Antibiotics   . Zinc      Current Medications:   Current Outpatient Medications:  .  cyclobenzaprine (FLEXERIL) 5 MG tablet, Take 1 tablet (5 mg total) by mouth daily as needed for muscle spasms.,  Disp: 20 tablet, Rfl: 0 .  drospirenone-ethinyl estradiol (NIKKI) 3-0.02 MG tablet, Nikki (28) 3 mg-0.02 mg tablet  Take 1 tablet every day by oral route., Disp: , Rfl:  .  meloxicam (MOBIC) 15 MG tablet, Take 1 tablet (15 mg total) by mouth daily., Disp: 30 tablet, Rfl: 0   Review of Systems:   Review of Systems  Constitutional: Negative for chills, fever, malaise/fatigue and weight loss.  HENT: Negative for hearing loss, sinus pain and sore throat.   Respiratory: Negative for cough and hemoptysis.   Cardiovascular: Negative for chest pain, palpitations, leg swelling and PND.  Gastrointestinal: Negative for abdominal pain, constipation, diarrhea, heartburn, nausea and vomiting.  Genitourinary: Negative for dysuria, frequency and urgency.  Musculoskeletal: Negative for back pain, myalgias and neck pain.  Skin: Negative for itching and rash.  Neurological: Negative for dizziness, tingling, seizures and  headaches.  Endo/Heme/Allergies: Negative for polydipsia.  Psychiatric/Behavioral: Negative for depression. The patient is not nervous/anxious.      Vitals:   Vitals:   09/02/18 0905  BP: 110/60  Pulse: (!) 55  Temp: 98.3 F (36.8 C)  TempSrc: Oral  SpO2: 98%  Weight: 163 lb 6.4 oz (74.1 kg)     Body mass index is 25.59 kg/m.  Physical Exam:   Physical Exam  Constitutional: She appears well-developed and well-nourished. She is cooperative.  Non-toxic appearance. She does not have a sickly appearance. She does not appear ill. No distress.  HENT:  Head: Normocephalic and atraumatic.  Right Ear: Tympanic membrane, external ear and ear canal normal. Tympanic membrane is not erythematous, not retracted and not bulging.  Left Ear: Tympanic membrane, external ear and ear canal normal. Tympanic membrane is not erythematous, not retracted and not bulging.  Eyes: Pupils are equal, round, and reactive to light. Conjunctivae, EOM and lids are normal.  Neck: Trachea normal and full passive range of motion without pain.  Cardiovascular: Normal rate, regular rhythm, S1 normal, S2 normal, normal heart sounds and intact distal pulses.  Pulmonary/Chest: Effort normal and breath sounds normal. No tachypnea. No respiratory distress. She has no decreased breath sounds. She has no wheezes. She has no rhonchi. She has no rales.  Abdominal: Soft. Normal appearance and bowel sounds are normal. There is no tenderness.  Musculoskeletal: Normal range of motion.  Lymphadenopathy:    She has no cervical adenopathy.  Neurological: She is alert. She has normal reflexes. No cranial nerve deficit or sensory deficit. GCS eye subscore is 4. GCS verbal subscore is 5. GCS motor subscore is 6.  Skin: Skin is warm, dry and intact.  Psychiatric: She has a normal mood and affect. Her speech is normal and behavior is normal.  Nursing note and vitals reviewed.   Assessment and Plan:    Tareka was seen today for  establish care.  Diagnoses and all orders for this visit:  Routine general medical examination at a health care facility  Today patient counseled on age appropriate routine health concerns for screening and prevention, each reviewed and up to date or declined. Immunizations reviewed and up to date or declined. We did not order any labs, patient declined at today's visit. Risk factors for depression reviewed and negative. Hearing function and visual acuity are intact. ADLs screened and addressed as needed. Functional ability and level of safety reviewed and appropriate. Education, counseling and referrals performed based on assessed risks today.   . Reviewed expectations re: course of current medical issues. . Discussed self-management of symptoms. . Outlined signs  and symptoms indicating need for more acute intervention. . Patient verbalized understanding and all questions were answered. . See orders for this visit as documented in the electronic medical record. . Patient received an After-Visit Summary.  Jarold Motto, PA-C

## 2018-10-12 NOTE — Telephone Encounter (Signed)
Pt seen 09/02/18.

## 2019-04-27 DIAGNOSIS — M7918 Myalgia, other site: Secondary | ICD-10-CM | POA: Diagnosis not present

## 2019-04-27 DIAGNOSIS — M9903 Segmental and somatic dysfunction of lumbar region: Secondary | ICD-10-CM | POA: Diagnosis not present

## 2019-04-27 DIAGNOSIS — M9904 Segmental and somatic dysfunction of sacral region: Secondary | ICD-10-CM | POA: Diagnosis not present

## 2019-04-27 DIAGNOSIS — M9905 Segmental and somatic dysfunction of pelvic region: Secondary | ICD-10-CM | POA: Diagnosis not present

## 2019-05-04 DIAGNOSIS — M9903 Segmental and somatic dysfunction of lumbar region: Secondary | ICD-10-CM | POA: Diagnosis not present

## 2019-05-04 DIAGNOSIS — M9905 Segmental and somatic dysfunction of pelvic region: Secondary | ICD-10-CM | POA: Diagnosis not present

## 2019-05-04 DIAGNOSIS — M7918 Myalgia, other site: Secondary | ICD-10-CM | POA: Diagnosis not present

## 2019-05-04 DIAGNOSIS — M9904 Segmental and somatic dysfunction of sacral region: Secondary | ICD-10-CM | POA: Diagnosis not present

## 2019-05-10 DIAGNOSIS — M9904 Segmental and somatic dysfunction of sacral region: Secondary | ICD-10-CM | POA: Diagnosis not present

## 2019-05-10 DIAGNOSIS — M9905 Segmental and somatic dysfunction of pelvic region: Secondary | ICD-10-CM | POA: Diagnosis not present

## 2019-05-10 DIAGNOSIS — M9903 Segmental and somatic dysfunction of lumbar region: Secondary | ICD-10-CM | POA: Diagnosis not present

## 2019-05-10 DIAGNOSIS — M7918 Myalgia, other site: Secondary | ICD-10-CM | POA: Diagnosis not present

## 2019-06-06 DIAGNOSIS — Z01419 Encounter for gynecological examination (general) (routine) without abnormal findings: Secondary | ICD-10-CM | POA: Diagnosis not present

## 2019-06-06 DIAGNOSIS — Z6824 Body mass index (BMI) 24.0-24.9, adult: Secondary | ICD-10-CM | POA: Diagnosis not present

## 2019-07-05 ENCOUNTER — Encounter: Payer: Self-pay | Admitting: Physician Assistant

## 2019-07-20 ENCOUNTER — Telehealth: Payer: Self-pay | Admitting: *Deleted

## 2019-07-20 NOTE — Telephone Encounter (Signed)
Patient states that she would like to keep appointment on 9/14 as she is currently out of town.

## 2019-07-20 NOTE — Telephone Encounter (Signed)
Left message on voicemail to call office. Offer pt and earlier appt then 9/14.

## 2019-07-20 NOTE — Telephone Encounter (Signed)
Noted  

## 2019-07-22 ENCOUNTER — Telehealth: Payer: Self-pay

## 2019-07-22 NOTE — Telephone Encounter (Signed)
Pt needs to schedule an appt if she wants COVID testing today or go to Urgent Care. She is scheduled to come in office on Monday will need to change to Doxy visit can not come to office.

## 2019-07-22 NOTE — Telephone Encounter (Signed)
Patient is aware that appt is virtual, went over instructions with patient.

## 2019-07-22 NOTE — Telephone Encounter (Signed)
Copied from [place] [id number]. Topic: General - Call Back - No Documentation >> Jul 22, 2019  1:57 PM Erick Blinks wrote: Reason for CRM: Pt called back for covid 19 screening. Tried calling office, please advise.

## 2019-07-22 NOTE — Telephone Encounter (Signed)
See note below

## 2019-07-25 ENCOUNTER — Encounter: Payer: Self-pay | Admitting: Physician Assistant

## 2019-07-25 ENCOUNTER — Ambulatory Visit (INDEPENDENT_AMBULATORY_CARE_PROVIDER_SITE_OTHER): Payer: BC Managed Care – PPO | Admitting: Physician Assistant

## 2019-07-25 ENCOUNTER — Other Ambulatory Visit: Payer: Self-pay

## 2019-07-25 ENCOUNTER — Other Ambulatory Visit (INDEPENDENT_AMBULATORY_CARE_PROVIDER_SITE_OTHER): Payer: BC Managed Care – PPO

## 2019-07-25 VITALS — Ht 67.0 in | Wt 155.0 lb

## 2019-07-25 DIAGNOSIS — R1084 Generalized abdominal pain: Secondary | ICD-10-CM

## 2019-07-25 LAB — CBC WITH DIFFERENTIAL/PLATELET
Basophils Absolute: 0 10*3/uL (ref 0.0–0.1)
Basophils Relative: 0.4 % (ref 0.0–3.0)
Eosinophils Absolute: 0 10*3/uL (ref 0.0–0.7)
Eosinophils Relative: 0.5 % (ref 0.0–5.0)
HCT: 36.8 % (ref 36.0–46.0)
Hemoglobin: 12.3 g/dL (ref 12.0–15.0)
Lymphocytes Relative: 20.8 % (ref 12.0–46.0)
Lymphs Abs: 1.4 10*3/uL (ref 0.7–4.0)
MCHC: 33.3 g/dL (ref 30.0–36.0)
MCV: 94.3 fl (ref 78.0–100.0)
Monocytes Absolute: 0.4 10*3/uL (ref 0.1–1.0)
Monocytes Relative: 6.4 % (ref 3.0–12.0)
Neutro Abs: 4.8 10*3/uL (ref 1.4–7.7)
Neutrophils Relative %: 71.9 % (ref 43.0–77.0)
Platelets: 236 10*3/uL (ref 150.0–400.0)
RBC: 3.9 Mil/uL (ref 3.87–5.11)
RDW: 12.9 % (ref 11.5–15.5)
WBC: 6.6 10*3/uL (ref 4.0–10.5)

## 2019-07-25 LAB — COMPREHENSIVE METABOLIC PANEL
ALT: 20 U/L (ref 0–35)
AST: 19 U/L (ref 0–37)
Albumin: 3.8 g/dL (ref 3.5–5.2)
Alkaline Phosphatase: 51 U/L (ref 39–117)
BUN: 22 mg/dL (ref 6–23)
CO2: 26 mEq/L (ref 19–32)
Calcium: 9.1 mg/dL (ref 8.4–10.5)
Chloride: 104 mEq/L (ref 96–112)
Creatinine, Ser: 0.97 mg/dL (ref 0.40–1.20)
GFR: 68.87 mL/min (ref >60.00)
Glucose, Bld: 94 mg/dL (ref 70–99)
Potassium: 4.6 mEq/L (ref 3.5–5.1)
Sodium: 136 mEq/L (ref 135–145)
Total Bilirubin: 0.4 mg/dL (ref 0.2–1.2)
Total Protein: 6 g/dL (ref 6.0–8.3)

## 2019-07-25 LAB — H. PYLORI ANTIBODY, IGG: H Pylori IgG: NEGATIVE

## 2019-07-25 NOTE — Addendum Note (Signed)
Addended by: Francis Dowse T on: 07/25/2019 01:59 PM   Modules accepted: Orders

## 2019-07-25 NOTE — Progress Notes (Signed)
Virtual Visit via Video   I connected with Bridget Mata on 07/25/19 at 10:00 AM EDT by a video enabled telemedicine application and verified that I am speaking with the correct person using two identifiers. Location patient: Home Location provider: [facility], Office Persons participating in the virtual visit: Bridget Mata, [place] PA-C.  I discussed the limitations of evaluation and management by telemedicine and the availability of in person appointments. The patient expressed understanding and agreed to proceed.    Subjective:   HPI:   Stomach cramps Pt c/o increase in stomach cramps and bloating in the past 2 months. Pt is having cramps after she eats. Doesn't necessarily resolve with defecation. Denies nausea or vomiting. Pt has tried digestive enzymes prior to eating but no help.  Recently cut out dairy and high fiber foods, and this has helped her symptoms. Does take magnesium 300 mg daily as well, doesn't cause loose stools.  She works out twice a day and feels as though this helps her symptoms.  Denies family history of IBD, IBS or colon cancer. Denies rectal bleeding, unintentional weight loss, unusual/new vitamin/mineral supplements.  Patient's last menstrual period was 07/15/2019.  ROS: See pertinent positives and negatives per HPI.  There are no active problems to display for this patient.   Social History   Tobacco Use  . Smoking status: Never Smoker  . Smokeless tobacco: Never Used  Substance Use Topics  . Alcohol use: Yes    Alcohol/week: 0.0 standard drinks    Comment: social    Current Outpatient Medications:  .  drospirenone-ethinyl estradiol (NIKKI) 3-0.02 MG tablet, Nikki (28) 3 mg-0.02 mg tablet  Take 1 tablet every day by oral route., Disp: , Rfl:  .  Multiple Vitamin (MULTIVITAMIN) tablet, Take 1 tablet by mouth daily., Disp: , Rfl:   Allergies  Allergen Reactions  . Sulfa Antibiotics   . Zinc     Objective:   VITALS:  Per patient if applicable, see vitals. GENERAL: Alert, appears well and in no acute distress. HEENT: Atraumatic, conjunctiva clear, no obvious abnormalities on inspection of external nose and ears. NECK: Normal movements of the head and neck. CARDIOPULMONARY: No increased WOB. Speaking in clear sentences. I:E ratio WNL.  MS: Moves all visible extremities without noticeable abnormality. PSYCH: Pleasant and cooperative, well-groomed. Speech normal rate and rhythm. Affect is appropriate. Insight and judgement are appropriate. Attention is focused, linear, and appropriate.  NEURO: CN grossly intact. Oriented as arrived to appointment on time with no prompting. Moves both UE equally.  SKIN: No obvious lesions, wounds, erythema, or cyanosis noted on face or hands.  Assessment and Plan:   Bridget Mata was seen today for stomach cramps.  Diagnoses and all orders for this visit:  Generalized abdominal pain -     CBC with Differential/Platelet; Future -     Comprehensive metabolic panel; Future -     H. pylori antibody, IgG; Future -     Celiac Panel; Future   Unclear etiology. No red flags on my discussion with her. Would like to check celiac panel, h pylori and CBC/CMP. Will trial IBgard. Low FODMAP diet discussed and provided. If work-up with labs negative and symptoms persist with above recommendations, will send to GI for further evaluation.  . Reviewed expectations re: course of current medical issues. . Discussed self-management of symptoms. . Outlined signs and symptoms indicating need for more acute intervention. . Patient verbalized understanding and all questions were answered. Marland Kitchen Health Maintenance issues including  appropriate healthy diet, exercise, and smoking avoidance were discussed with patient. . See orders for this visit as documented in the electronic medical record.  I discussed the assessment and treatment plan with the patient. The patient was provided an opportunity to ask  questions and all were answered. The patient agreed with the plan and demonstrated an understanding of the instructions.   The patient was advised to call back or seek an in-person evaluation if the symptoms worsen or if the condition fails to improve as anticipated.   CMA or LPN served as scribe during this visit. History, Physical, and Plan performed by medical provider. The above documentation has been reviewed and is accurate and complete.   ViborgSamantha Narelle Schoening, [place] 07/25/2019

## 2019-07-25 NOTE — Patient Instructions (Signed)
It was great to see you!  We will update you with labs as soon as they have returned.  Consider the Low FODMAP diet.  Trial the IBguard supplement to see if that helps.  We could also trial Bentyl, if this is something that you would like, please let me know.  Let's follow-up in 1 month, sooner if you have concerns.  Take care,  Inda Coke PA-C

## 2019-07-29 LAB — CELIAC PNL 2 RFLX ENDOMYSIAL AB TTR
(tTG) Ab, IgA: <1 U/mL
(tTG) Ab, IgG: <1 U/mL
Endomysial Ab IgA: NEGATIVE
Gliadin IgA: 2 U (ref <20)
Gliadin IgG: 2 U (ref <20)
Immunoglobulin A: 45 mg/dL — ABNORMAL LOW (ref 47–310)

## 2019-10-02 ENCOUNTER — Encounter: Payer: Self-pay | Admitting: Physician Assistant

## 2019-10-05 ENCOUNTER — Other Ambulatory Visit: Payer: Self-pay

## 2019-10-10 ENCOUNTER — Encounter: Payer: Self-pay | Admitting: Physician Assistant

## 2019-10-10 ENCOUNTER — Ambulatory Visit (INDEPENDENT_AMBULATORY_CARE_PROVIDER_SITE_OTHER): Payer: BC Managed Care – PPO | Admitting: Physician Assistant

## 2019-10-10 ENCOUNTER — Other Ambulatory Visit: Payer: Self-pay

## 2019-10-10 VITALS — BP 110/70 | HR 51 | Temp 97.2°F | Ht 67.0 in | Wt 156.0 lb

## 2019-10-10 DIAGNOSIS — R1084 Generalized abdominal pain: Secondary | ICD-10-CM

## 2019-10-10 DIAGNOSIS — R140 Abdominal distension (gaseous): Secondary | ICD-10-CM | POA: Diagnosis not present

## 2019-10-10 NOTE — Patient Instructions (Signed)
It was great to see you!  You will be contacted about your referral to GI.  Take care,  Inda Coke PA-C

## 2019-10-10 NOTE — Progress Notes (Signed)
Bridget Mata is a 28 y.o. female is here to follow up on abdominal cramps.  I acted as a Education administrator for [employer], PA-C Bridget Lex, LPN  History of Present Illness:   Chief Complaint  Patient presents with  . Abdominal Cramping    HPI  Abdominal cramps  Last office visit with me regarding this was 07/25/19: Pt c/o increase in stomach cramps and bloating in the past 2 months. Pt is having cramps after she eats. Doesn't necessarily resolve with defecation. Denies nausea or vomiting. Pt has tried digestive enzymes prior to eating but no help.  Recently cut out dairy and high fiber foods, and this has helped her symptoms. Does take magnesium 300 mg daily as well, doesn't cause loose stools. She works out twice a day and feels as though this helps her symptoms. Denies family history of IBD, IBS or colon cancer. Denies rectal bleeding, unintentional weight loss, unusual/new vitamin/mineral supplements.  Today Pt following up today on abdominal cramps. Pt also c/o a lot of bloating and gas. Pt tried the IB gard thinks it helped but did not take consistently. She denies any new symptoms since she last saw me -- denies rectal bleeding, significant changes in stools, unintentional weight loss, vomiting. She has been trying to follow some of the FODMAP diet recommendations but feels like her symptoms do not improve with these changes. She has been avoiding dairy, which may or may not be helping.  Denies excessive caffeine and alcohol, does drink 1 gal water daily.  Patient's last menstrual period was 10/01/2019.  Wt Readings from Last 3 Encounters:  10/10/19 156 lb (70.8 kg)  07/25/19 155 lb (70.3 kg)  09/02/18 163 lb 6.4 oz (74.1 kg)     Health Maintenance Due  Topic Date Due  . HIV Screening  07/14/2007  . TETANUS/TDAP  07/14/2011  . INFLUENZA VACCINE  06/11/2019    History reviewed. No pertinent past medical history.   Social History   Socioeconomic History  . Marital  status: Single    Spouse name: Not on file  . Number of children: Not on file  . Years of education: Not on file  . Highest education level: Not on file  Occupational History  . Not on file  Social Needs  . Financial resource strain: Not on file  . Food insecurity    Worry: Not on file    Inability: Not on file  . Transportation needs    Medical: Not on file    Non-medical: Not on file  Tobacco Use  . Smoking status: Never Smoker  . Smokeless tobacco: Never Used  Substance and Sexual Activity  . Alcohol use: Yes    Alcohol/week: 0.0 standard drinks    Comment: social  . Drug use: Never  . Sexual activity: Not on file  Lifestyle  . Physical activity    Days per week: Not on file    Minutes per session: Not on file  . Stress: Not on file  Relationships  . Social Herbalist on phone: Not on file    Gets together: Not on file    Attends religious service: Not on file    Active member of club or organization: Not on file    Attends meetings of clubs or organizations: Not on file    Relationship status: Not on file  . Intimate partner violence    Fear of current or ex partner: Not on file    Emotionally  abused: Not on file    Physically abused: Not on file    Forced sexual activity: Not on file  Other Topics Concern  . Not on file  Social History Narrative   Single.   Works in Chief Financial OfficerMarketing.   [place] for [employer] [place]   Enjoys volleyball, active in crossfit.     Past Surgical History:  Procedure Laterality Date  . WISDOM TOOTH EXTRACTION      Family History  Problem Relation Age of Onset  . Hyperlipidemia Father   . Hypertension Father   . Breast cancer Neg Hx   . Colon cancer Neg Hx     PMHx, SurgHx, SocialHx, FamHx, Medications, and Allergies were reviewed in the Visit Navigator and updated as appropriate.   There are no active problems to display for this patient.   Social History   Tobacco Use  . Smoking status: Never Smoker  .  Smokeless tobacco: Never Used  Substance Use Topics  . Alcohol use: Yes    Alcohol/week: 0.0 standard drinks    Comment: social  . Drug use: Never    Current Medications and Allergies:    Current Outpatient Medications:  .  drospirenone-ethinyl estradiol (NIKKI) 3-0.02 MG tablet, Nikki (28) 3 mg-0.02 mg tablet  Take 1 tablet every day by oral route., Disp: , Rfl:  .  Multiple Vitamin (MULTIVITAMIN) tablet, Take 1 tablet by mouth daily., Disp: , Rfl:    Allergies  Allergen Reactions  . Sulfa Antibiotics   . Zinc     Review of Systems   ROS Negative unless otherwise specified per HPI.  Vitals:   Vitals:   10/10/19 0847  BP: 110/70  Pulse: (!) 51  Temp: (!) 97.2 F (36.2 C)  TempSrc: Temporal  SpO2: 97%  Weight: 156 lb (70.8 kg)  Height: 5\' 7"  (1.702 m)     Body mass index is 24.43 kg/m.   Physical Exam:    Physical Exam Vitals signs and nursing note reviewed.  Constitutional:      General: She is not in acute distress.    Appearance: She is well-developed. She is not ill-appearing or toxic-appearing.  Cardiovascular:     Rate and Rhythm: Normal rate and regular rhythm.     Pulses: Normal pulses.     Heart sounds: Normal heart sounds, S1 normal and S2 normal.     Comments: No LE edema Pulmonary:     Effort: Pulmonary effort is normal.     Breath sounds: Normal breath sounds.  Abdominal:     General: Abdomen is flat. Bowel sounds are increased.     Palpations: Abdomen is soft.     Tenderness: There is no abdominal tenderness. There is no guarding or rebound.  Skin:    General: Skin is warm and dry.  Neurological:     Mental Status: She is alert.     GCS: GCS eye subscore is 4. GCS verbal subscore is 5. GCS motor subscore is 6.  Psychiatric:        Speech: Speech normal.        Behavior: Behavior normal. Behavior is cooperative.      Assessment and Plan:    Bridget Mata was seen today for abdominal cramping.  Diagnoses and all orders for this visit:   Generalized abdominal pain; Bloating Uncontrolled. Unclear etiology of symptoms. Possible IBS vs dietary intolerances vs other. Will refer to GI for further evaluation and treatment. Worsening precautions advised in the interim. -     Ambulatory  referral to Gastroenterology  . Reviewed expectations re: course of current medical issues. . Discussed self-management of symptoms. . Outlined signs and symptoms indicating need for more acute intervention. . Patient verbalized understanding and all questions were answered. . See orders for this visit as documented in the electronic medical record. . Patient received an After Visit Summary.  CMA or LPN served as scribe during this visit. History, Physical, and Plan performed by medical provider. The above documentation has been reviewed and is accurate and complete.  Jarold Motto, PA-C [place] [place] 10/10/2019  Follow-up: No follow-ups on file.

## 2019-10-11 ENCOUNTER — Encounter: Payer: Self-pay | Admitting: Gastroenterology

## 2019-11-08 ENCOUNTER — Encounter: Payer: Self-pay | Admitting: Gastroenterology

## 2019-11-08 ENCOUNTER — Ambulatory Visit: Payer: BC Managed Care – PPO | Admitting: Gastroenterology

## 2019-11-08 ENCOUNTER — Other Ambulatory Visit (INDEPENDENT_AMBULATORY_CARE_PROVIDER_SITE_OTHER): Payer: BC Managed Care – PPO

## 2019-11-08 VITALS — BP 112/78 | HR 58 | Temp 98.6°F | Ht 67.0 in | Wt 158.1 lb

## 2019-11-08 DIAGNOSIS — R143 Flatulence: Secondary | ICD-10-CM

## 2019-11-08 DIAGNOSIS — R140 Abdominal distension (gaseous): Secondary | ICD-10-CM | POA: Diagnosis not present

## 2019-11-08 DIAGNOSIS — R1084 Generalized abdominal pain: Secondary | ICD-10-CM | POA: Diagnosis not present

## 2019-11-08 LAB — SEDIMENTATION RATE: Sed Rate: 5 mm/hr (ref 0–20)

## 2019-11-08 LAB — HIGH SENSITIVITY CRP: CRP, High Sensitivity: 6.37 mg/L — ABNORMAL HIGH (ref 0.000–5.000)

## 2019-11-08 MED ORDER — RIFAXIMIN 550 MG PO TABS: 550.0000 mg | ORAL_TABLET | Freq: Three times a day (TID) | ORAL | 0 refills | Status: DC

## 2019-11-08 NOTE — Patient Instructions (Addendum)
If you are age 26 or older, your body mass index should be between 23-30. Your Body mass index is 24.77 kg/m. If this is out of the aforementioned range listed, please consider follow up with your Primary Care Provider.  If you are age 69 or younger, your body mass index should be between 19-25. Your Body mass index is 24.77 kg/m. If this is out of the aformentioned range listed, please consider follow up with your Primary Care Provider.   We have sent the following medications to your pharmacy for you to pick up at your convenience: [facility] will contact you regarding this medication.  Your provider has requested that you go to the basement level for lab work before leaving today. Press "B" on the elevator. The lab is located at the first door on the left as you exit the elevator.  Start probiotic after antibiotic.  Florastor, Electronics engineer, or VSL #3.  These are over-the-counter.   Follow up with me in four weeks.  Please call the office for an appointment as the schedule is not available at this time.  Thank you for choosing me and [facility].   Alonza Bogus, PA-C

## 2019-11-08 NOTE — Progress Notes (Signed)
11/08/2019 Bridget Mata [id number] 03-20-1991   HISTORY OF PRESENT ILLNESS: This is a pleasant 28 year old female who is new to our office.  She has been referred here by her PCP, Jarold Motto, PA-C, for evaluation regarding complaints of abdominal pain and bloating.  She describes diffuse lower abdominal discomfort with associated gas and bloating after eating almost anything.  She says that she has tried eliminating different foods from her diet.  Eliminating dairy seemed to help slightly, but definitely did not cause complete resolution of her symptoms.  She tried eliminating gluten, but says that she really does not eat a whole lot of gluten anyway.  She even looked into the FODMAP diet a little bit and tried eliminating some of those things.  She had celiac labs performed and they were "normal", but her total IgA level was slightly low.  PCP tested her for H. pylori, which was negative.  CBC and CMP were normal.  She says that she has tried several over-the-counter treatment regimens including probiotics, digestive enzymes, Gas-X, Beano, IBgard and says that none of them have helped at all.  She denies absolutely any issues with her bowel habits.  Has normal stools.  Denies rectal bleeding.  She says that these symptoms have only been present for the past several months, never had any problems in the past.  Her symptoms have now been affecting her personal life as she feels that she cannot even go out with her friends and eat dinner, etc. because she develops so much abdominal discomfort and gas/bloating and just feels miserable.    History reviewed. No pertinent past medical history. Past Surgical History:  Procedure Laterality Date  . WISDOM TOOTH EXTRACTION      reports that she has never smoked. She has never used smokeless tobacco. She reports current alcohol use. She reports that she does not use drugs. family history includes Hyperlipidemia in her father; Hypertension in her  father. Allergies  Allergen Reactions  . Sulfa Antibiotics   . Zinc       Outpatient Encounter Medications as of 11/08/2019  Medication Sig  . Multiple Vitamin (MULTIVITAMIN) tablet Take 1 tablet by mouth daily.  . [DISCONTINUED] drospirenone-ethinyl estradiol (NIKKI) 3-0.02 MG tablet    No facility-administered encounter medications on file as of 11/08/2019.     REVIEW OF SYSTEMS  : All other systems reviewed and negative except where noted in the History of Present Illness.   PHYSICAL EXAM: BP 112/78 (BP Location: Left Arm, Patient Position: Sitting, Cuff Size: Normal)   Pulse (!) 58   Temp 98.6 F (37 C) (Oral)   Ht 5\' 7"  (1.702 m)   Wt 158 lb 2 oz (71.7 kg)   BMI 24.77 kg/m  General: Well developed white female in no acute distress Head: Normocephalic and atraumatic Eyes:  Sclerae anicteric, conjunctiva pink. Ears: Normal auditory acuity Lungs: Clear throughout to auscultation; no increased WOB. Heart: Regular rate and rhythm; no M/R/G. Abdomen: Soft, non-distended.  BS present.  Minimal lower abdominal TTP. Musculoskeletal: Symmetrical with no gross deformities  Skin: No lesions on visible extremities Extremities: No edema  Neurological: Alert oriented x 4, grossly non-focal Psychological:  Alert and cooperative. Normal mood and affect  ASSESSMENT AND PLAN: *28 year old female with complaints of postprandial lower abdominal discomfort with associated bloating and gas/flatulence.  Occurs every time that she eats for the past several months.  Never had any issues in the past.  Has tried several over-the-counter regimens without improvement in  symptoms and has eliminated several things from her diet without much improvement as well.  Mild improvement with elimination of dairy products.  No associated issues with bowel movements.  Celiac labs showed a mildly low IgA, so may not be completely accurate.  We discussed this and that definitive diagnosis would be with EGD and  small bowel biopsy.  She reports that she honestly does not eat much gluten anyways and she went completely gluten-free for some time without resolution of symptoms.  I am hard pressed to do a colonoscopy on her at this point with no bowel complaints.  I think next step may be CT scan if we proceed with some type of imaging.  I am going to check a sed rate and CRP first.  We also discussed IBS/small intestinal bacterial overgrowth.  We discussed breath testing versus empiric treatment with Xifaxan.  She would like to just try the antibiotic since it has minimal side effects.  Prescription sent.  Advised to start a daily probiotic in the form of Florastor, align, or VSL #3 after completing the treatment.  I am going to see her back in 4 weeks for follow-up.  [name]  CC:  Inda Coke, [place]

## 2019-11-14 NOTE — Telephone Encounter (Signed)
Bridget Mata Bonds have you seen anything about the xifaxan?

## 2019-11-15 NOTE — Telephone Encounter (Signed)
Pt called inquiring about xifaxan. Pls call her.

## 2019-11-18 DIAGNOSIS — Z113 Encounter for screening for infections with a predominantly sexual mode of transmission: Secondary | ICD-10-CM | POA: Diagnosis not present

## 2019-11-18 DIAGNOSIS — N9089 Other specified noninflammatory disorders of vulva and perineum: Secondary | ICD-10-CM | POA: Diagnosis not present

## 2019-11-18 DIAGNOSIS — Z6825 Body mass index (BMI) 25.0-25.9, adult: Secondary | ICD-10-CM | POA: Diagnosis not present

## 2019-11-21 ENCOUNTER — Telehealth: Payer: Self-pay

## 2019-11-21 NOTE — Telephone Encounter (Signed)
Spoke with patient. She states she has received medication now.  Made a follow up appt for 12/09/19

## 2019-12-09 ENCOUNTER — Encounter: Payer: Self-pay | Admitting: Gastroenterology

## 2019-12-09 ENCOUNTER — Ambulatory Visit: Payer: BC Managed Care – PPO | Admitting: Gastroenterology

## 2019-12-09 VITALS — BP 106/76 | HR 55 | Temp 97.9°F | Ht 67.0 in | Wt 162.1 lb

## 2019-12-09 DIAGNOSIS — R140 Abdominal distension (gaseous): Secondary | ICD-10-CM | POA: Diagnosis not present

## 2019-12-09 DIAGNOSIS — R143 Flatulence: Secondary | ICD-10-CM | POA: Diagnosis not present

## 2019-12-09 DIAGNOSIS — R1084 Generalized abdominal pain: Secondary | ICD-10-CM | POA: Diagnosis not present

## 2019-12-09 DIAGNOSIS — Z01818 Encounter for other preprocedural examination: Secondary | ICD-10-CM | POA: Diagnosis not present

## 2019-12-09 NOTE — Progress Notes (Addendum)
12/09/2019 Bridget Mata [id number] [birth year]   HISTORY OF PRESENT ILLNESS: This is a pleasant 29 year old female who is here for follow-up of her complaints of abdominal bloating with associated generalized abdominal pain/discomfort and flatulence.  I saw her on November 08, 2019.  Please see my note from that date for further details regarding her symptoms.  Nonetheless, at that visit, we decided to treat her with a course of Xifaxan.  She just completed that a few days ago, but has not seen much improvement as a result of that course of medicine.  She says that initially she felt optimistic about it, but at this point she now denies any improvement.  She did start the align probiotic as well so continues taking that.  She is just desperate to make sure that there is nothing else causing her symptoms and to find a regimen that will help her manage her symptoms.  She denies any significant bowel issues, but does admit that she has been drinking some magnesium called calm for the past 6 months as she does tend to be a little bit more on the constipated side of things if anything.   Past Medical History:  Diagnosis Date  . Abdominal pain   . Belching   . Bloated abdomen   . Chest pain    Past Surgical History:  Procedure Laterality Date  . WISDOM TOOTH EXTRACTION      reports that she has never smoked. She has never used smokeless tobacco. She reports current alcohol use. She reports that she does not use drugs. family history includes Hyperlipidemia in her father; Hypertension in her father. Allergies  Allergen Reactions  . Sulfa Antibiotics   . Zinc       Outpatient Encounter Medications as of 12/09/2019  Medication Sig  . drospirenone-ethinyl estradiol (YASMIN) 3-0.03 MG tablet Take 1 tablet by mouth daily.  . Multiple Vitamin (MULTIVITAMIN) tablet Take 1 tablet by mouth daily.  . [DISCONTINUED] rifaximin (XIFAXAN) 550 MG TABS tablet Take 1 tablet (550 mg total) by mouth 3  (three) times daily. (Patient not taking: Reported on 12/09/2019)   No facility-administered encounter medications on file as of 12/09/2019.     REVIEW OF SYSTEMS  : All other systems reviewed and negative except where noted in the History of Present Illness.   PHYSICAL EXAM: BP 106/76 (BP Location: Left Arm, Patient Position: Sitting, Cuff Size: Normal)   Pulse (!) 55   Temp 97.9 F (36.6 C)   Ht 5\' 7"  (1.702 m)   Wt 162 lb 2 oz (73.5 kg)   SpO2 99%   BMI 25.39 kg/m  General: Well developed white female in no acute distress Head: Normocephalic and atraumatic Eyes:  Sclerae anicteric, conjunctiva pink. Ears: Normal auditory acuity Lungs: Clear throughout to auscultation; no increased WOB. Heart: Regular rate and rhythm; no M/R/G. Abdomen: Soft, non-distended.  BS present.  Non-tender. Rectal:  Will be done at the time of colonoscopy. Musculoskeletal: Symmetrical with no gross deformities  Skin: No lesions on visible extremities Extremities: No edema  Neurological: Alert oriented x 4, grossly non-focal Psychological:  Alert and cooperative. Normal mood and affect  ASSESSMENT AND PLAN: *29 year old female with complaints of postprandial lower abdominal discomfort with associated bloating and gas/flatulence.  Occurs every time that she eats for the past several months.  Never had any issues in the past.  Has tried several over-the-counter regimens without improvement in symptoms and has eliminated several things from her diet without much  improvement as well.  Mild improvement with elimination of dairy products.  No associated issues with bowel movements.  Celiac labs showed a mildly low IgA, so may not be completely accurate.  We discussed this and that definitive diagnosis would be with EGD and small bowel biopsy.  She reports that she honestly does not eat much gluten anyways and she went completely gluten-free for some time without resolution of symptoms.  She completed a course of  Xifaxan followed by probiotic and did not have any resolution of her symptoms.  She is desperate to rule out any other causes of her symptoms and to find something that will help give her relief.  I am going to proceed by scheduling her for colonoscopy with Dr. [name].  Pending results may or may not need CT scan.  I am not starting any other new medications for now.  **The risks, benefits, and alternatives to colonoscopy were discussed with the patient and she consents to proceed.    CC:  Inda Coke, [place]

## 2019-12-09 NOTE — Patient Instructions (Addendum)
Please purchase the following medications over the counter and take as directed:  IB gard  You have been scheduled for a colonoscopy. Please follow written instructions given to you at your visit today.  Please pick up your prep supplies at the pharmacy within the next 1-3 days. If you use inhalers (even only as needed), please bring them with you on the day of your procedure.  Due to recent changes in healthcare laws, you may see the results of your imaging and laboratory studies on MyChart before your provider has had a chance to review them.  We understand that in some cases there may be results that are confusing or concerning to you. Not all laboratory results come back in the same time frame and the provider may be waiting for multiple results in order to interpret others.  Please give [place] 48 hours in order for your provider to thoroughly review all the results before contacting the office for clarification of your results.

## 2019-12-13 ENCOUNTER — Encounter: Payer: Self-pay | Admitting: Internal Medicine

## 2019-12-13 ENCOUNTER — Ambulatory Visit (INDEPENDENT_AMBULATORY_CARE_PROVIDER_SITE_OTHER): Payer: BC Managed Care – PPO

## 2019-12-13 DIAGNOSIS — S61411A Laceration without foreign body of right hand, initial encounter: Secondary | ICD-10-CM | POA: Diagnosis not present

## 2019-12-13 DIAGNOSIS — Z1159 Encounter for screening for other viral diseases: Secondary | ICD-10-CM | POA: Diagnosis not present

## 2019-12-13 LAB — SARS CORONAVIRUS 2 (TAT 6-24 HRS): SARS Coronavirus 2: NEGATIVE

## 2019-12-15 ENCOUNTER — Other Ambulatory Visit: Payer: Self-pay

## 2019-12-15 ENCOUNTER — Encounter: Payer: Self-pay | Admitting: Internal Medicine

## 2019-12-15 ENCOUNTER — Ambulatory Visit (AMBULATORY_SURGERY_CENTER): Payer: BC Managed Care – PPO | Admitting: Internal Medicine

## 2019-12-15 VITALS — BP 106/49 | HR 51 | Temp 97.5°F | Resp 21 | Ht 67.0 in | Wt 162.0 lb

## 2019-12-15 DIAGNOSIS — Z1211 Encounter for screening for malignant neoplasm of colon: Secondary | ICD-10-CM | POA: Diagnosis not present

## 2019-12-15 DIAGNOSIS — R1084 Generalized abdominal pain: Secondary | ICD-10-CM | POA: Diagnosis not present

## 2019-12-15 DIAGNOSIS — R768 Other specified abnormal immunological findings in serum: Secondary | ICD-10-CM

## 2019-12-15 MED ORDER — SODIUM CHLORIDE 0.9 % IV SOLN: 500.0000 mL | Freq: Once | INTRAVENOUS | Status: DC

## 2019-12-15 NOTE — Op Note (Addendum)
[facility] Patient Name: Bridget Mata Procedure Date: 12/15/2019 3:40 PM MRN: [id number] Endoscopist: Iva Boop , MD Age: 29 Referring MD:  Date of Birth: 12/08/1990 Gender: Female Account #: [id number] Procedure:                Colonoscopy Indications:              Generalized abdominal pain Medicines:                Propofol per Anesthesia, Monitored Anesthesia Care Procedure:                Pre-Anesthesia Assessment:                           - Prior to the procedure, a History and Physical                            was performed, and patient medications and                            allergies were reviewed. The patient's tolerance of                            previous anesthesia was also reviewed. The risks                            and benefits of the procedure and the sedation                            options and risks were discussed with the patient.                            All questions were answered, and informed consent                            was obtained. Prior Anticoagulants: The patient has                            taken no previous anticoagulant or antiplatelet                            agents. ASA Grade Assessment: I - A normal, healthy                            patient. After reviewing the risks and benefits,                            the patient was deemed in satisfactory condition to                            undergo the procedure.                           After obtaining informed consent, the colonoscope  was passed under direct vision. Throughout the                            procedure, the patient's blood pressure, pulse, and                            oxygen saturations were monitored continuously. The                            Colonoscope was introduced through the anus and                            advanced to the the terminal ileum, with                            identification of the  appendiceal orifice and IC                            valve. The colonoscopy was performed without                            difficulty. The patient tolerated the procedure                            well. The quality of the bowel preparation was                            excellent. The terminal ileum, ileocecal valve,                            appendiceal orifice, and rectum were photographed. Scope In: 3:52:16 PM Scope Out: 4:05:37 PM Scope Withdrawal Time: 0 hours 7 minutes 45 seconds  Total Procedure Duration: 0 hours 13 minutes 21 seconds  Findings:                 The perianal and digital rectal examinations were                            normal.                           The terminal ileum appeared normal.                           The entire examined colon appeared normal on direct                            and retroflexion views. Complications:            No immediate complications. Estimated Blood Loss:     Estimated blood loss: none. Impression:               - The examined portion of the ileum was normal.                           - The entire examined colon  is normal on direct and                            retroflexion views.                           - No specimens collected. Recommendation:           - Patient has a contact number available for                            emergencies. The signs and symptoms of potential                            delayed complications were discussed with the                            patient. Return to normal activities tomorrow.                            Written discharge instructions were provided to the                            patient.                           - Resume previous diet.                           - Continue present medications.                           - No recommendation at this time regarding repeat                            colonoscopy due to young age.                           - IgA level was slightly  low - doubt it is a                            problem but recheck that and IgG and IgM                           - consider SIBO test - need to wait 30 d after                            colonoscopy prep                           - she did havs slight transient relief on Xifaxan                            treatment before                           -  Trial of IB Delene Ruffini - regularly - she has tried prn                            note she says "I have always had a sensitive                            stomach but now I cannot eat anything without                            significant bloating that is affecting my life" Iva Boop, MD 12/15/2019 4:15:06 PM This report has been signed electronically.

## 2019-12-15 NOTE — Patient Instructions (Addendum)
The colonoscopy was normal.  I am checking immunoglobulin levels - you had a slightly low Immunoglobulin A level - I do not think it is a problem but since you feel bad and we are not suyre why yet I am checking the levels for immunoglobulin A, G and M  I will let you know results and plans when it returns.  You can try IB Delene Ruffini (an over the counter agent) if you have not already - 2 tablets before meals. It has peppermint oil which relaxes the intestinal muscles and can help.  I appreciate the opportunity to care for you. Iva Boop, MD, [facility]  Please read handouts provided. Continue present medications.    YOU HAD AN ENDOSCOPIC PROCEDURE TODAY AT THE [facility]:   Refer to the procedure report that was given to you for any specific questions about what was found during the examination.  If the procedure report does not answer your questions, please call your gastroenterologist to clarify.  If you requested that your care partner not be given the details of your procedure findings, then the procedure report has been included in a sealed envelope for you to review at your convenience later.  YOU SHOULD EXPECT: Some feelings of bloating in the abdomen. Passage of more gas than usual.  Walking can help get rid of the air that was put into your GI tract during the procedure and reduce the bloating. If you had a lower endoscopy (such as a colonoscopy or flexible sigmoidoscopy) you may notice spotting of blood in your stool or on the toilet paper. If you underwent a bowel prep for your procedure, you may not have a normal bowel movement for a few days.  Please Note:  You might notice some irritation and congestion in your nose or some drainage.  This is from the oxygen used during your procedure.  There is no need for concern and it should clear up in a day or so.  SYMPTOMS TO REPORT IMMEDIATELY:   Following lower endoscopy (colonoscopy or flexible sigmoidoscopy):  Excessive  amounts of blood in the stool  Significant tenderness or worsening of abdominal pains  Swelling of the abdomen that is new, acute  Fever of 100F or higher    For urgent or emergent issues, a gastroenterologist can be reached at any hour by calling [phone].   DIET:  We do recommend a small meal at first, but then you may proceed to your regular diet.  Drink plenty of fluids but you should avoid alcoholic beverages for 24 hours.  ACTIVITY:  You should plan to take it easy for the rest of today and you should NOT DRIVE or use heavy machinery until tomorrow (because of the sedation medicines used during the test).    FOLLOW UP: Our staff will call the number listed on your records 48-72 hours following your procedure to check on you and address any questions or concerns that you may have regarding the information given to you following your procedure. If we do not reach you, we will leave a message.  We will attempt to reach you two times.  During this call, we will ask if you have developed any symptoms of COVID 19. If you develop any symptoms (ie: fever, flu-like symptoms, shortness of breath, cough etc.) before then, please call [phone].  If you test positive for Covid 19 in the 2 weeks post procedure, please call and report this information to [place].    If any  biopsies were taken you will be contacted by phone or by letter within the next 1-3 weeks.  Please call us at [phone] if you have not heard about the biopsies in 3 weeks.    SIGNATURES/CONFIDENTIALITY: You and/or your care partner have signed paperwork which will be entered into your electronic medical record.  These signatures attest to the fact that that the information above on your After Visit Summary has been reviewed and is understood.  Full responsibility of the confidentiality of this discharge information lies with you and/or your care-partner.

## 2019-12-15 NOTE — Progress Notes (Signed)
To PACU, vss. Report to [email or web address] 

## 2019-12-19 ENCOUNTER — Telehealth: Payer: Self-pay

## 2019-12-19 NOTE — Telephone Encounter (Signed)
  Follow up Call-  Call back number 12/15/2019  Post procedure Call Back phone  # [phone]  Permission to leave phone message Yes  Some recent data might be hidden     Patient questions:  Do you have a fever, pain , or abdominal swelling? No. Pain Score  0 *  Have you tolerated food without any problems? Yes.    Have you been able to return to your normal activities? Yes.    Do you have any questions about your discharge instructions: Diet   No. Medications  No. Follow up visit  No.  Do you have questions or concerns about your Care? No.  Actions: * If pain score is 4 or above: No action needed, pain <4. 1. Have you developed a fever since your procedure? no  2.   Have you had an respiratory symptoms (SOB or cough) since your procedure? no  3.   Have you tested positive for COVID 19 since your procedure no  4.   Have you had any family members/close contacts diagnosed with the COVID 19 since your procedure?  no   If yes to any of these questions please route to Laverna Peace, RN and Jennye Boroughs, Charity fundraiser.

## 2019-12-19 NOTE — Telephone Encounter (Signed)
LVM

## 2019-12-30 ENCOUNTER — Telehealth: Payer: Self-pay | Admitting: Internal Medicine

## 2019-12-30 ENCOUNTER — Other Ambulatory Visit: Payer: BC Managed Care – PPO

## 2019-12-30 DIAGNOSIS — R768 Other specified abnormal immunological findings in serum: Secondary | ICD-10-CM | POA: Diagnosis not present

## 2019-12-30 NOTE — Telephone Encounter (Signed)
Pt calling about labs Dr. Leone Payor mentioned having drawn on her colon report. Discussed with pt that the orders are in epic and she can go to the lab in the basement M-F between 7:30am-5pm to have these drawn, no appt needed.

## 2019-12-31 LAB — IGG, IGA, IGM
IgG (Immunoglobin G), Serum: 812 mg/dL (ref 600–1640)
IgM, Serum: 60 mg/dL (ref 50–300)
Immunoglobulin A: 38 mg/dL — ABNORMAL LOW (ref 47–310)

## 2020-01-02 ENCOUNTER — Other Ambulatory Visit: Payer: Self-pay | Admitting: Internal Medicine

## 2020-01-02 DIAGNOSIS — R768 Other specified abnormal immunological findings in serum: Secondary | ICD-10-CM

## 2020-01-02 DIAGNOSIS — R140 Abdominal distension (gaseous): Secondary | ICD-10-CM

## 2020-01-03 ENCOUNTER — Other Ambulatory Visit: Payer: BC Managed Care – PPO

## 2020-01-03 DIAGNOSIS — R768 Other specified abnormal immunological findings in serum: Secondary | ICD-10-CM

## 2020-01-03 DIAGNOSIS — R140 Abdominal distension (gaseous): Secondary | ICD-10-CM | POA: Diagnosis not present

## 2020-01-09 LAB — CELIAC DISEASE HLA DQ ASSOC.
DQ2 (DQA1 0501/0505,DQB1 02XX): NEGATIVE
DQ8 (DQA1 03XX, DQB1 0302): NEGATIVE

## 2020-01-11 ENCOUNTER — Other Ambulatory Visit: Payer: Self-pay | Admitting: *Deleted

## 2020-01-11 ENCOUNTER — Telehealth: Payer: Self-pay | Admitting: *Deleted

## 2020-01-11 DIAGNOSIS — R102 Pelvic and perineal pain: Secondary | ICD-10-CM

## 2020-01-11 DIAGNOSIS — R140 Abdominal distension (gaseous): Secondary | ICD-10-CM

## 2020-01-11 DIAGNOSIS — R1084 Generalized abdominal pain: Secondary | ICD-10-CM

## 2020-01-11 NOTE — Telephone Encounter (Signed)
-----   Message from Iva Boop, MD sent at 01/10/2020  5:41 PM EST ----- Regarding: schedule [place] Please schedule complete abdominal and a pelvic transabdominal ultrasound  Dx abdominal bloating Generalized abdominal + pelvic pain

## 2020-01-11 NOTE — Telephone Encounter (Addendum)
Orders for abd [place] and pelvic transabd US in Epic.   Called the patient who preferred to schedule the ultrasounds herself, number given to patient to schedule.

## 2020-01-13 NOTE — Telephone Encounter (Signed)
Patient scheduled for both [place] on 3/10 at 10:30 am and 11:30 am.

## 2020-01-18 ENCOUNTER — Ambulatory Visit (HOSPITAL_COMMUNITY): Payer: Self-pay

## 2020-01-18 ENCOUNTER — Ambulatory Visit (HOSPITAL_COMMUNITY): Payer: BC Managed Care – PPO

## 2020-01-20 ENCOUNTER — Other Ambulatory Visit (HOSPITAL_COMMUNITY): Payer: Self-pay

## 2020-01-20 ENCOUNTER — Ambulatory Visit (HOSPITAL_COMMUNITY): Payer: Self-pay

## 2022-08-04 ENCOUNTER — Encounter: Payer: Self-pay | Admitting: *Deleted

## 2022-10-23 ENCOUNTER — Encounter: Payer: Self-pay | Admitting: *Deleted
